# Patient Record
Sex: Male | Born: 1955 | Race: Black or African American | Hispanic: No | Smoking: Former smoker
Health system: Southern US, Community
[De-identification: ages and names within clinical notes are randomized; demographics above are authoritative.]

## PROBLEM LIST (undated history)

## (undated) DIAGNOSIS — J439 Emphysema, unspecified: Secondary | ICD-10-CM

## (undated) DIAGNOSIS — J45909 Unspecified asthma, uncomplicated: Secondary | ICD-10-CM

## (undated) DIAGNOSIS — M1990 Unspecified osteoarthritis, unspecified site: Secondary | ICD-10-CM

## (undated) DIAGNOSIS — G629 Polyneuropathy, unspecified: Secondary | ICD-10-CM

---

## 2011-01-06 ENCOUNTER — Emergency Department (HOSPITAL_COMMUNITY): Payer: PRIVATE HEALTH INSURANCE

## 2011-01-06 ENCOUNTER — Emergency Department (HOSPITAL_COMMUNITY)
Admission: EM | Admit: 2011-01-06 | Discharge: 2011-01-06 | Disposition: A | Discharge status: Home / Self Care | Payer: PRIVATE HEALTH INSURANCE | Attending: Emergency Medicine | Admitting: Emergency Medicine

## 2011-01-06 DIAGNOSIS — Z79899 Other long term (current) drug therapy: Secondary | ICD-10-CM | POA: Insufficient documentation

## 2011-01-06 DIAGNOSIS — Z7982 Long term (current) use of aspirin: Secondary | ICD-10-CM | POA: Insufficient documentation

## 2011-01-06 DIAGNOSIS — J449 Chronic obstructive pulmonary disease, unspecified: Secondary | ICD-10-CM | POA: Insufficient documentation

## 2011-01-06 DIAGNOSIS — M549 Dorsalgia, unspecified: Secondary | ICD-10-CM | POA: Insufficient documentation

## 2011-01-06 DIAGNOSIS — J4489 Other specified chronic obstructive pulmonary disease: Secondary | ICD-10-CM | POA: Insufficient documentation

## 2011-01-06 DIAGNOSIS — R040 Epistaxis: Secondary | ICD-10-CM | POA: Insufficient documentation

## 2011-01-06 DIAGNOSIS — G8929 Other chronic pain: Secondary | ICD-10-CM | POA: Insufficient documentation

## 2011-01-06 DIAGNOSIS — R079 Chest pain, unspecified: Secondary | ICD-10-CM | POA: Insufficient documentation

## 2011-01-06 LAB — CBC
HCT: 31.6 % — ABNORMAL LOW (ref 39.0–52.0)
Hemoglobin: 10.3 g/dL — ABNORMAL LOW (ref 13.0–17.0)
MCH: 25.8 pg — ABNORMAL LOW (ref 26.0–34.0)
MCHC: 32.6 g/dL (ref 30.0–36.0)
MCV: 79.2 fL (ref 78.0–100.0)

## 2011-01-06 LAB — POCT I-STAT TROPONIN I: Troponin i, poc: 0 ng/mL (ref 0.00–0.08)

## 2011-01-08 ENCOUNTER — Emergency Department (HOSPITAL_COMMUNITY): Payer: PRIVATE HEALTH INSURANCE

## 2011-01-08 ENCOUNTER — Inpatient Hospital Stay (HOSPITAL_COMMUNITY)
Admission: EM | Admit: 2011-01-08 | Discharge: 2011-01-11 | DRG: 812 | Disposition: A | Discharge status: Home / Self Care | Payer: PRIVATE HEALTH INSURANCE | Attending: Internal Medicine | Admitting: Internal Medicine

## 2011-01-08 DIAGNOSIS — D500 Iron deficiency anemia secondary to blood loss (chronic): Principal | ICD-10-CM | POA: Diagnosis present

## 2011-01-08 DIAGNOSIS — J45909 Unspecified asthma, uncomplicated: Secondary | ICD-10-CM | POA: Diagnosis present

## 2011-01-08 DIAGNOSIS — Z7982 Long term (current) use of aspirin: Secondary | ICD-10-CM

## 2011-01-08 DIAGNOSIS — E860 Dehydration: Secondary | ICD-10-CM | POA: Diagnosis present

## 2011-01-08 DIAGNOSIS — R040 Epistaxis: Secondary | ICD-10-CM | POA: Diagnosis present

## 2011-01-08 DIAGNOSIS — Z79899 Other long term (current) drug therapy: Secondary | ICD-10-CM

## 2011-01-08 DIAGNOSIS — Z87891 Personal history of nicotine dependence: Secondary | ICD-10-CM

## 2011-01-08 DIAGNOSIS — J3489 Other specified disorders of nose and nasal sinuses: Secondary | ICD-10-CM | POA: Diagnosis present

## 2011-01-08 LAB — POCT I-STAT, CHEM 8
BUN: 16 mg/dL (ref 6–23)
Chloride: 105 mEq/L (ref 96–112)
Creatinine, Ser: 0.9 mg/dL (ref 0.50–1.35)
Potassium: 3.7 mEq/L (ref 3.5–5.1)
Sodium: 138 mEq/L (ref 135–145)

## 2011-01-08 LAB — PROTIME-INR
INR: 1.05 (ref 0.00–1.49)
Prothrombin Time: 13.9 seconds (ref 11.6–15.2)

## 2011-01-08 LAB — CBC
Hemoglobin: 9.3 g/dL — ABNORMAL LOW (ref 13.0–17.0)
MCH: 25.5 pg — ABNORMAL LOW (ref 26.0–34.0)
MCV: 78.6 fL (ref 78.0–100.0)
RBC: 3.65 MIL/uL — ABNORMAL LOW (ref 4.22–5.81)
WBC: 4.6 10*3/uL (ref 4.0–10.5)

## 2011-01-09 LAB — CBC
HCT: 27.6 % — ABNORMAL LOW (ref 39.0–52.0)
MCH: 25.1 pg — ABNORMAL LOW (ref 26.0–34.0)
MCV: 79.5 fL (ref 78.0–100.0)
Platelets: 210 10*3/uL (ref 150–400)
RBC: 3.47 MIL/uL — ABNORMAL LOW (ref 4.22–5.81)
WBC: 3.9 10*3/uL — ABNORMAL LOW (ref 4.0–10.5)

## 2011-01-09 LAB — RAPID URINE DRUG SCREEN, HOSP PERFORMED
Amphetamines: NOT DETECTED
Barbiturates: NOT DETECTED
Benzodiazepines: NOT DETECTED
Cocaine: NOT DETECTED
Opiates: POSITIVE — AB
Tetrahydrocannabinol: NOT DETECTED

## 2011-01-09 LAB — APTT: aPTT: 35 seconds (ref 24–37)

## 2011-01-09 LAB — BASIC METABOLIC PANEL
CO2: 22 mEq/L (ref 19–32)
Calcium: 8.7 mg/dL (ref 8.4–10.5)
Chloride: 105 mEq/L (ref 96–112)
Glucose, Bld: 79 mg/dL (ref 70–99)
Potassium: 3.7 mEq/L (ref 3.5–5.1)
Sodium: 136 mEq/L (ref 135–145)

## 2011-01-09 LAB — DIFFERENTIAL
Eosinophils Absolute: 0.2 10*3/uL (ref 0.0–0.7)
Lymphocytes Relative: 38 % (ref 12–46)
Lymphs Abs: 1.5 10*3/uL (ref 0.7–4.0)
Monocytes Relative: 12 % (ref 3–12)
Neutrophils Relative %: 46 % (ref 43–77)

## 2011-01-09 LAB — POCT I-STAT TROPONIN I: Troponin i, poc: 0 ng/mL (ref 0.00–0.08)

## 2011-01-10 LAB — CBC
Hemoglobin: 10.3 g/dL — ABNORMAL LOW (ref 13.0–17.0)
MCH: 25.9 pg — ABNORMAL LOW (ref 26.0–34.0)
MCHC: 32.9 g/dL (ref 30.0–36.0)
Platelets: 222 10*3/uL (ref 150–400)
RDW: 17.1 % — ABNORMAL HIGH (ref 11.5–15.5)

## 2011-01-11 LAB — CROSSMATCH
ABO/RH(D): O POS
Antibody Screen: NEGATIVE
Unit division: 0
Unit division: 0
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
## 2011-02-23 NOTE — H&P (Signed)
NAME:  Jeremy Galvan, Jeremy Galvan NO.:  [id number]  MEDICAL RECORD NO.:  [id number]  LOCATION:  [facility]                         FACILITY:  [facility]  PHYSICIAN:  Carlota Raspberry, MD         DATE OF BIRTH:  1955-11-21  DATE OF ADMISSION:  01/08/2011 DATE OF DISCHARGE:                             HISTORY & PHYSICAL   CHIEF COMPLAINT:  Shortness of breath, nosebleeds, and syncopal episodes.  HISTORY OF PRESENT ILLNESS:  This 55 year old male with a complicated history of nosebleeds and possible septal perforation presents with headaches, shortness of breath, nosebleeds, and several syncopal episodes over the last couple of weeks.  The patient states that starting in the fall of last year he began having nosebleeds several times per week and he would bleed for 15-20 minutes per episode.  These were associated with weakness and decreased appetite over the last couple of months and in the last 2-3 weeks, he has had two to three episodes of syncope and he feels that he is getting more weak.  He presented to the emergency room 2 days ago for this and was also complaining of some atypical chest pain at that time.  He was ruled out and sent home with the instructions to follow up with an ENT doctor here in [place].  He was on his way to see an ENT in [place] in the [facility] system today but was having more nosebleeds and therefore presented to the ED instead.  Here in the emergency room his vital signs have been stable with systolics ranging in the 115-120s over 70s to 80s with pulses in the 80s and oxygen saturations in the 98% on room air.  His hematocrit was initially seen to be 32 on point of care i-STAT lab values, but then a true CBC showed it to be 28.7, which was down from about 31 two days ago.  Platelet count was normal and his coags showed an INR of 1.05.  No PTT was drawn though his chemistry panel appears overall unremarkable with a BUN and creatinine of 16  and 0.9  The patient relates a long and complicated history of these nosebleeds such that he was originally seen by a doctor in [place], [place], who did some type of procedure to put some button into the septum of his nose where he has a hole.  This stopped the bleeding for about a month or so ago, but after sneezing it was dislodged and the button was taken out.  He was then referred to see an ENT in [facility] after being told that he needed definitive surgical procedure for this, however, this was apparently not done.  He was now trying to establish care with an ENT in [place] to have this done.  Review of systems also positive for headache, diffuse weakness and fatigue, and syncopal episodes.  He has some atypical chest pain, but it has been ruled out in the ED.  His EKG appears unchanged with some LVH and some premature beats and what appears to be some Q waves in his inferior leads as well too.  Review of systems otherwise negative.  Past medical history  is significant for what appears to be Q waves in his inferior leads and what he self describes as history of a racing heart rate for which he was being seen quite frequently in the emergency room 6-7 years ago and eventually got a port placement for poor venous access.  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  He also reports scoliosis, neuropathy, and a "problem with his right lung."  However, we do not have extensive medical records in our E chart.  Medications were unable to be reconciled as the patient did not have his list and was not really too clear on what he takes at home, but the ED report from two days ago shows that he takes aspirin, Ativan, benzonatate, iron, Levaquin, and potassium chloride.  He states that his sister took his medication  list when she left.  Allergies are noted to DARVOCET, IBUPROFEN, NITROGLYCERIN, ROBITUSSIN, and ULTRAM.  SOCIAL HISTORY:  He lives in [place], [place].  His primary care doctor is Dr. Byrd Hesselbach is up there as well.  He is a single father of three boys.  He does have a sister.  He is currently a nonsmoker but he does report a 15-year smoking history and quit 9 years ago.  He also quit drinking and doing smoking crack 9 years ago.  Notably, he states that he has only snorted cocaine twice.  He denies any IV drug use.  FAMILY HISTORY:  He denies any history of any bleeding problems.  His father had colon cancer.  His mother had heart problems.  PHYSICAL EXAM:  VITAL SIGNS:  Blood pressure was 115-120/70-80, pulses in the 80s, oxygen saturation 98% on room air. GENERAL:  He is a thin male lying in the hospital stretcher with his eyes shut.  He appears uncomfortable but not in distress. HEENT:  Pupils are equal and round and reactive to light.  He has no scleral icterus.  Examination of his nares with the speculum shows some dry crusted blood and a septal perforation.  He is not currently bleeding. LUNGS:  Clear to auscultation bilaterally.  No wheezes, crackles, or rales. HEART:  Regular rate and rhythm with slight systolic murmur.  He has a right-sided port in place. ABDOMEN:  Soft, nontender, nondistended. EXTREMITIES:  There is no bilateral lower extremity edema. SKIN:  Warm and nondiaphoretic. NEUROLOGIC:  He is intact.  He is spontaneously moving all of his extremities.  He is alert and conversant.  Labs are significant for normal chemistry.  Hematocrit 32 on i-STAT versus 28.7 versus a true CBC.  INR is 1.05.  Troponins are negative x2. Chest x-ray done in the emergency room shows mild bibasilar air space opacities, may reflect atelectasis or possibly pneumonia.  He had a CT head done for his headaches which shows no acute intracranial pathology but mild small vessel ischemic  microangiopathy.  ASSESSMENT/PLAN:  This is a 55 year old male with a history of severe asthma, ? inferior myocardial infarction who presents with a several month history of nosebleeds with what appears to be a septal perforation on physical exam, headache, shortness of breath, and fatigue possibly representing symptomatic anemia. 1. Anemia.  I am unclear what his true hematocrit is as his initial     hematocrit on arrival was 32, which is stable from 2 days ago,  however, repeat one done about 20 minutes later was 28.7 and he is     receiving IV fluids, so this may be dilutional as well.  Given that     his vital signs are stable and he is not coagulopathic and not     bleeding at present, I chose to wait until the morning and get a     repeat CBC to see what it truly is as opposed to empirically     transfusing the patient.  Nevertheless, I did speak with him that     transfusion may improve him, but I would like to see where his CBC     stands.  I will also stop the IV fluids that are running at     present. 2. Chest x-ray with bibasilar opacities.  He has no white count and no     reported fevers.  Therefore, I doubt that this represents     pneumonia, and therefore I will not cover with antibiotics at     present. 3. Epistaxis.  The patient has a complicated history and has seen     multiple providers for this and given the fact that he is now     evaluated twice in 2 days for this and it is complicated by syncope     that may be due to anemia, I think it warrants an inpatient ENT     evaluation.  So, we will consult ENT.  He was apparently supposed     to have some type of surgery for this.  In the morning would     recommend touching base with his outpatient providers to get a     better sense of what the true outpatient workup has been.  His PCP     is Dr. Byrd Hesselbach in [place] in [place] and he states that he saw     some type of ENT in [place], [place]. 4. FEN.  I will  stop the IV fluids as his vitals are stable and IV     fluids may be contributing to dilutional anemia.  We will give him     a regular diet. 5. Access.  He has a right-sided port in.  We will get a type and     cross. 6. Code status.  He is a full code.  We will admit the patient to team     4 [place].          ______________________________ Carlota Raspberry, MD     EB/MEDQ  D:  01/09/2011  T:  01/09/2011  Job:  [id number]  Electronically Signed by Carlota Raspberry MD on 02/23/2011 12:02:36 PM

## 2011-03-03 NOTE — Discharge Summary (Signed)
NAMEJAIQUAN, TEMME NO.:  [id number]  MEDICAL RECORD NO.:  [id number]  LOCATION:  3713                         FACILITY:  [facility]  PHYSICIAN:  Zannie Cove, MD     DATE OF BIRTH:  06-05-1955  DATE OF ADMISSION:  01/08/2011 DATE OF DISCHARGE:                              DISCHARGE SUMMARY   PRIMARY CARE PHYSICIAN:  Adonis Huguenin, MD in [place], [place].  DISCHARGE DIAGNOSES: 1. Chronic epistaxis. 2. 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 3. Syncope secondary to anemia. 4. Asthma. 5. History of shoulder and hip surgery. 6. History of neuropathy.  DISCHARGE MEDICATIONS: 1. Vicodin 5/325 one tablet q.6 h p.r.n. 2. Saline nasal spray, 1 spray nasally q.2-4 h p.r.n. 3. Ativan 1 mg tablet, half tab p.o. b.i.d. p.r.n. 4. Advair 250/50 two puffs q.12 h. 5. Albuterol inhaler 90 mcg 2 puffs q.4 hours p.r.n. 6. Ferrous sulfate 325 mg p.o. b.i.d. 7. Tylenol 500 mg p.o. q.8 h p.r.n.  CONSULTANTS:  Jefry H. Pollyann Kennedy, MD with ENT.  DIAGNOSTICS INVESTIGATIONS:  Chest x-ray, August 19, mild bibasilar airspace opacities, may reflect atelectasis or possibly pneumonia.  CT of the head showed no acute intracranial abnormality.  HOSPITAL COURSE:  Mr. Witucki is a 55 year old black male with history of chronic epistaxis for several years, presented to the hospital with worsening of his epistaxis, dizziness, syncopal events, headache, etc. On evaluation, he was found to have chronic septal perforation, normocytic anemia, and dehydration. 1. For his chronic epistaxis, ENT consultation was requested which was     a provided by Dr. Brynda Peon.  He felt that his perforation was     chronic and too large to benefit from surgery and recommended     saline nasal spray at this time and follow up in his office in his     epistaxis recurs.  The patient is also advised about cessation of     inhaled cocaine use. 2. Anemia secondary to longstanding epistaxis status post 1 unit  of     PRBC.  Hemoglobin improved from 8.7 to 10.3.  He is also being     continued on his ferrous sulfate twice a day. 3. Syncope, likely secondary to anemia, chronic epistaxis.  Did not     have any events on telemetry, ruled out for MI based on cardiac     markers.  Echocardiogram done, report is pending.  I do not expect     anything acute to delay his discharge.  The patient is anxious to     go home.  We will let him go home today.  It is anything remarkable     on echo, we will send a report to his primary doctor.  DISCHARGE CONDITION:  Stable.  VITAL SIGNS:  Temperature is 98.2, pulse 55, blood pressure 132/84, respirations 20, satting 99% on room air.  DISCHARGE LABS:  White count 7.3, hemoglobin 10.3, platelets 222.  DISCHARGE FOLLOWUP: 1. With primary physician, Dr. Byrd Hesselbach in 1 week. 2. Dr. Brynda Peon with ENT as needed.     Zannie Cove, MD     PJ/MEDQ  D:  01/11/2011  T:  01/11/2011  Job:  [id number]  cc:   Jefry H. Pollyann Kennedy, MD Adonis Huguenin, MD  Electronically Signed by Zannie Cove  on 03/03/2011 02:05:54 PM

## 2012-07-17 IMAGING — CR DG CHEST 2V
2 series · 2 of 2 positions shown · non-contrast
Comparison: None

CLINICAL DATA: Chest pain.

CHEST - 2 VIEW

[w chest pa]
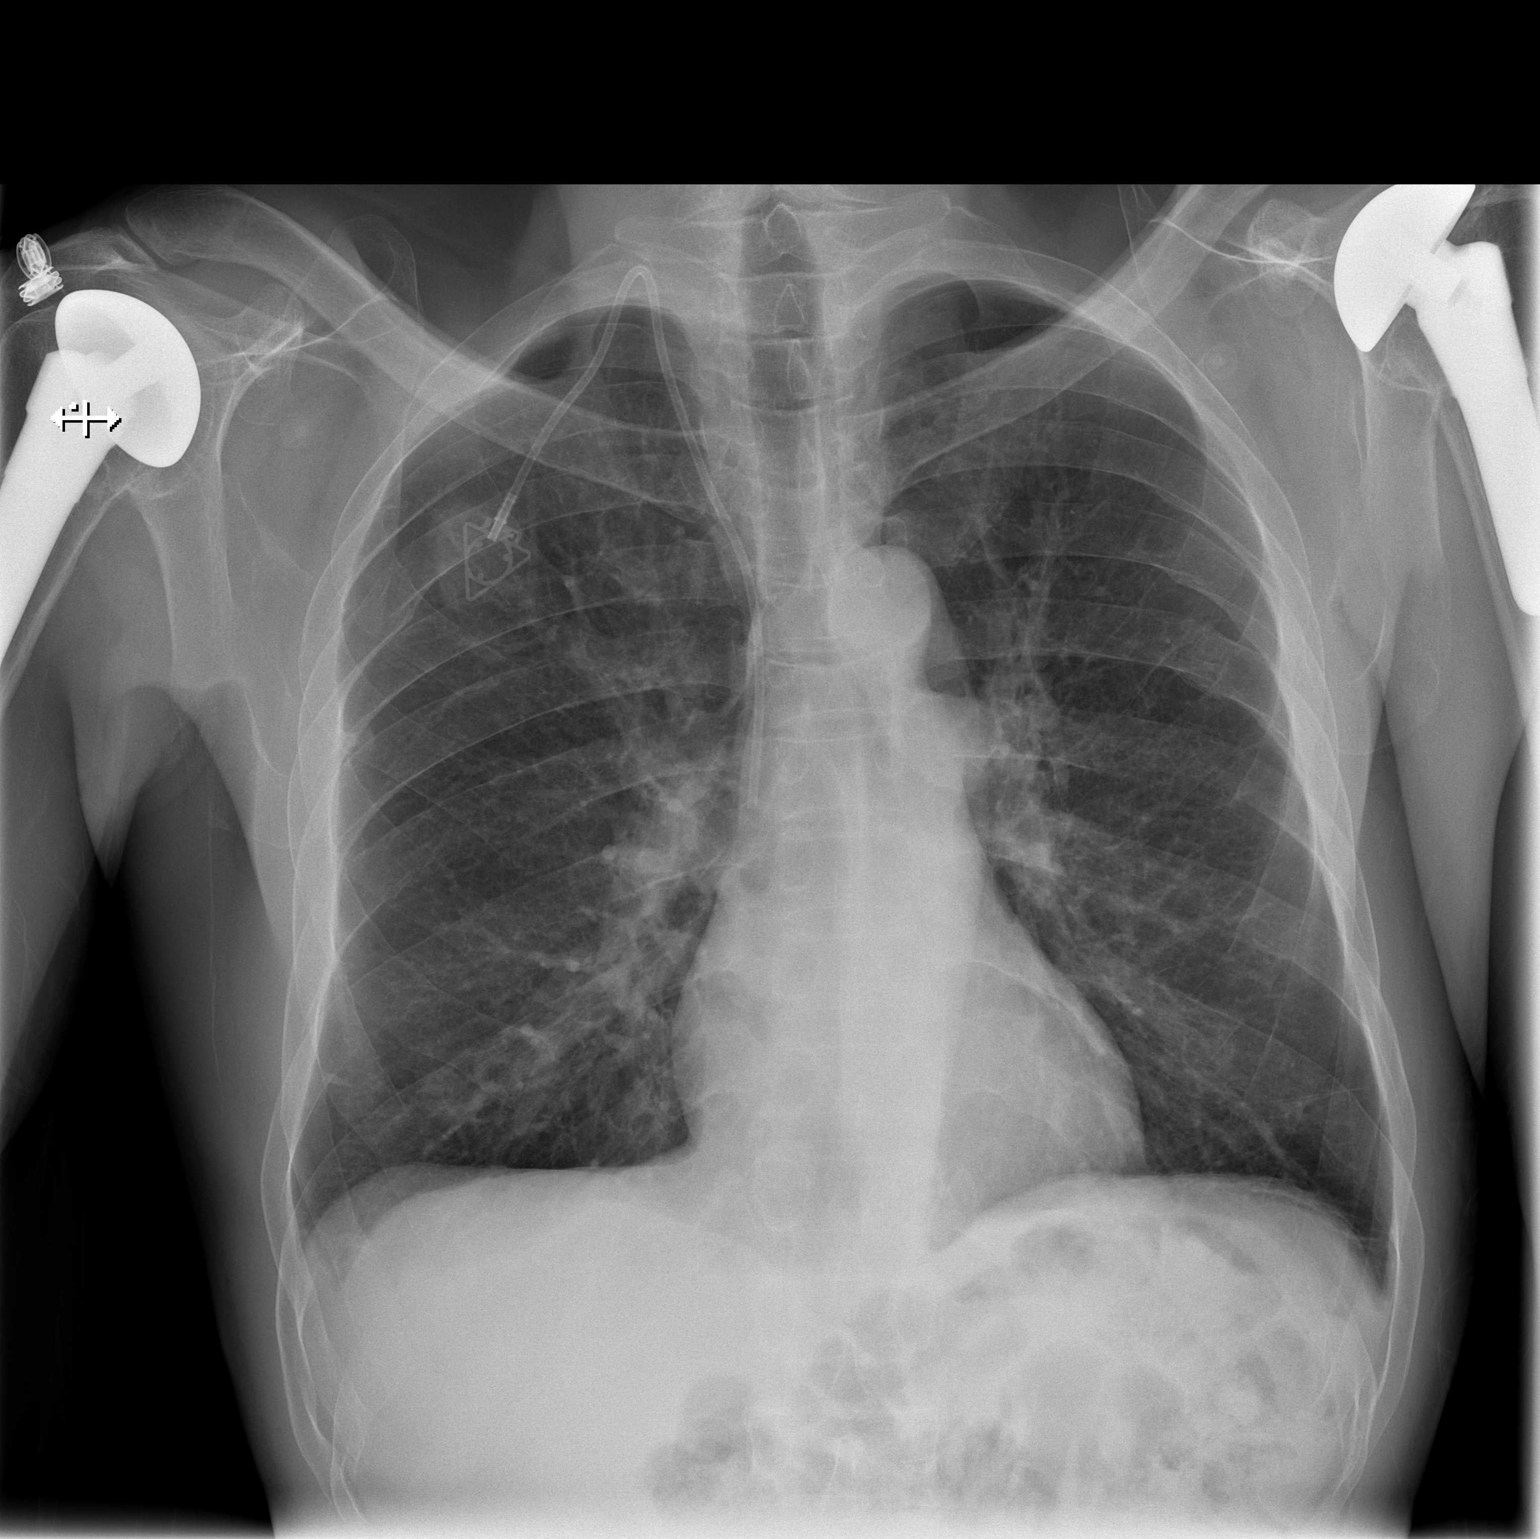

[w chest lat]
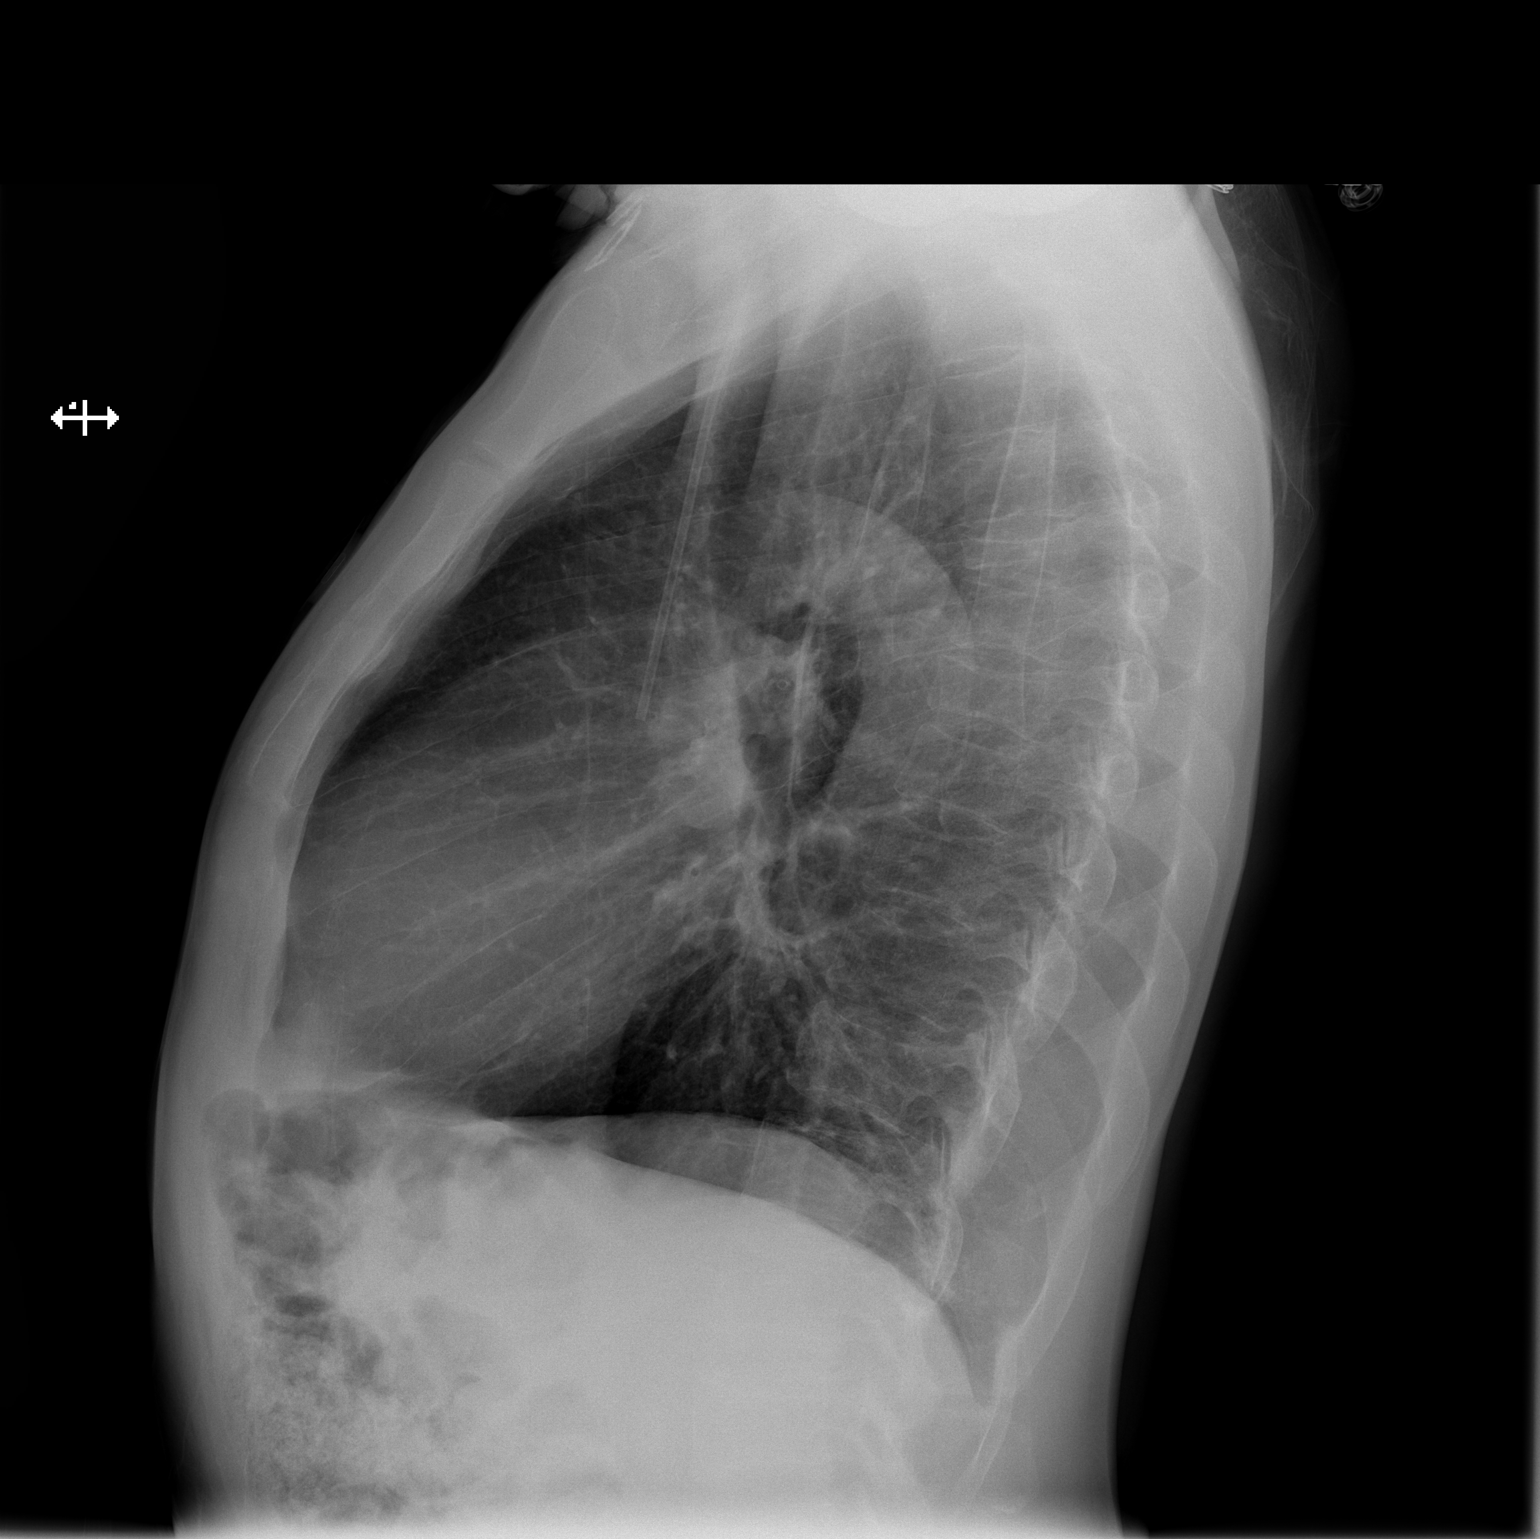

[2 of 2 positions shown; findings below may reference images not displayed]

FINDINGS: The Port-A-Cath is in good position.  There are bilateral
humeral prosthesis.  The cardiac silhouette, mediastinal and hilar
contours are within normal limits.  The lungs are clear.  No
worrisome masses or nodules.  Remote healed rib fractures are
noted.
IMPRESSION: No acute cardiopulmonary findings.

## 2012-07-19 IMAGING — CT CT HEAD W/O CM
1 of 2 series · 13 of 30 positions shown, 17 images · non-contrast
Comparison: None.

CLINICAL DATA: Altered level of consciousness; syncope.

CT HEAD WITHOUT CONTRAST
TECHNIQUE: Contiguous axial images were obtained from the base of
the skull through the vertex without contrast.

[Series 2: brain · axial · 0.49mm/px · z∈[+152,+277]mm · 13 of 28 slices shown, 17 images]
[im 2/28  brain]
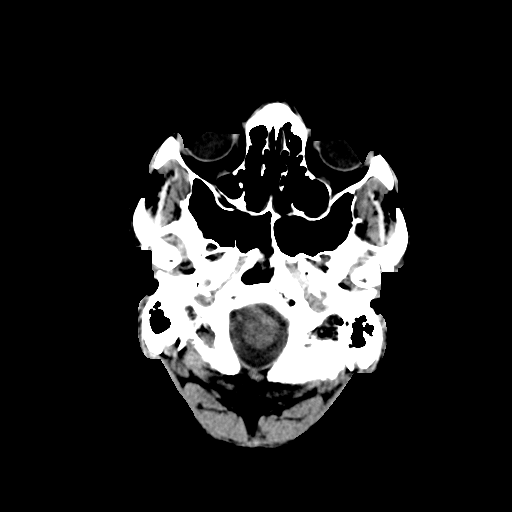
[im 2/28  bone]
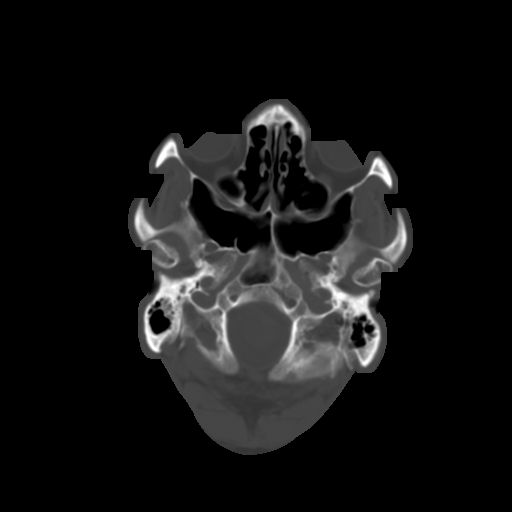
[im 4/28  brain]
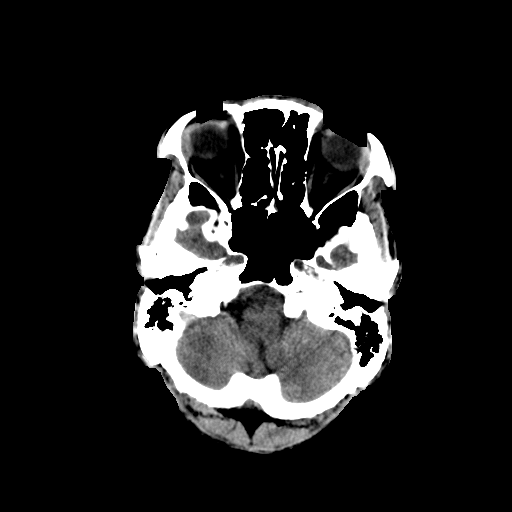
[im 6/28  brain]
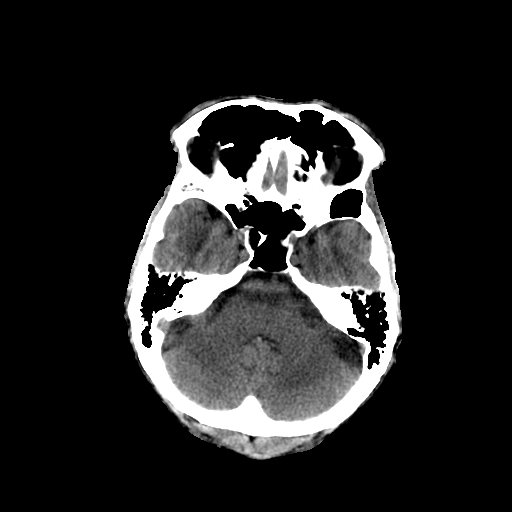
[im 8/28  brain]
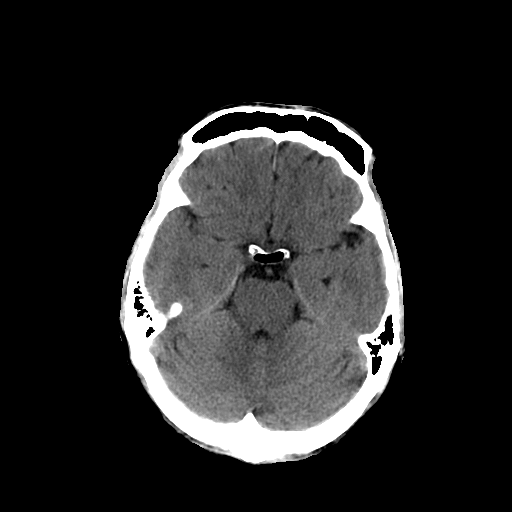
[im 10/28  brain]
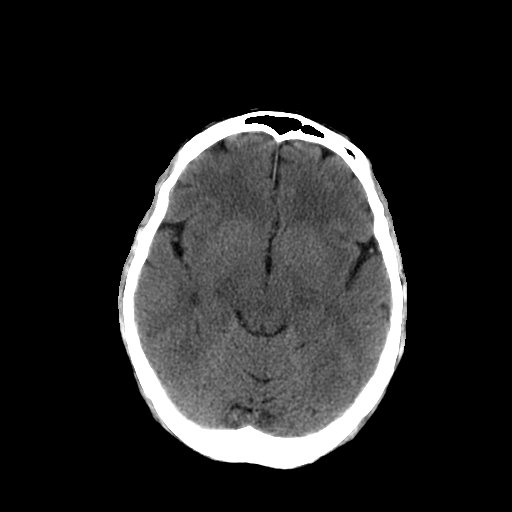
[im 10/28  bone]
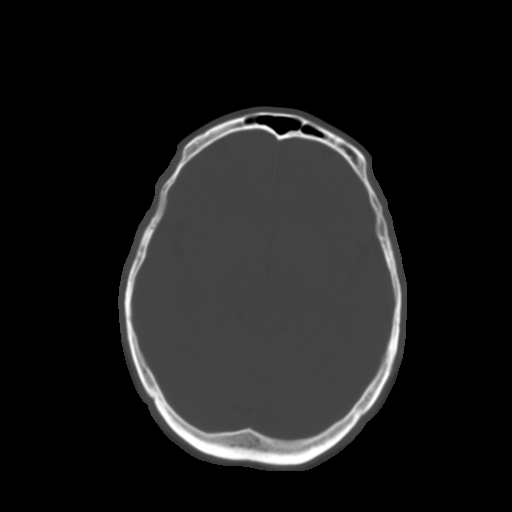
[im 12/28  brain]
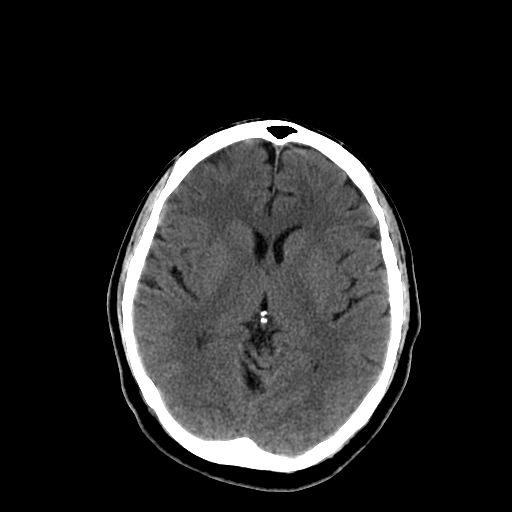
[im 14/28  brain]
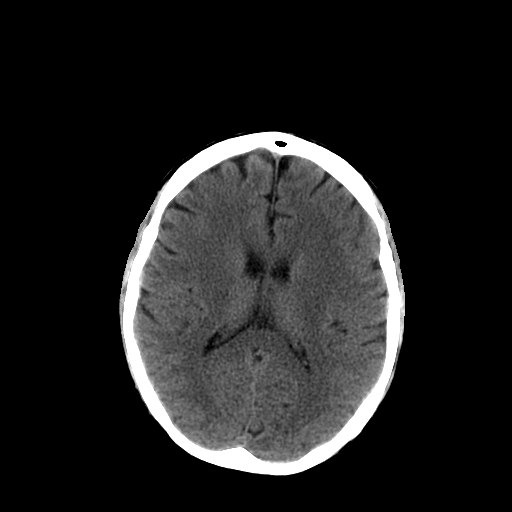
[im 16/28  brain]
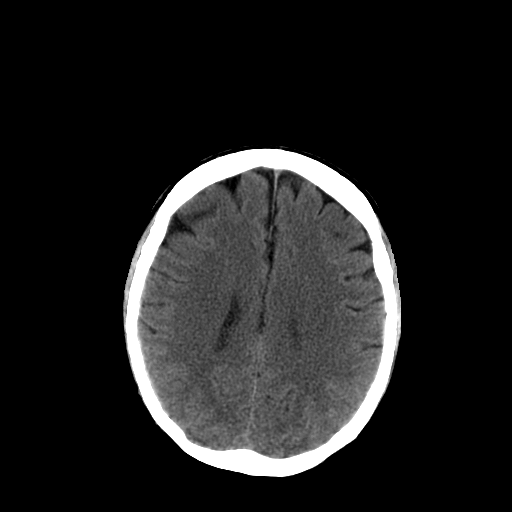
[im 18/28  brain]
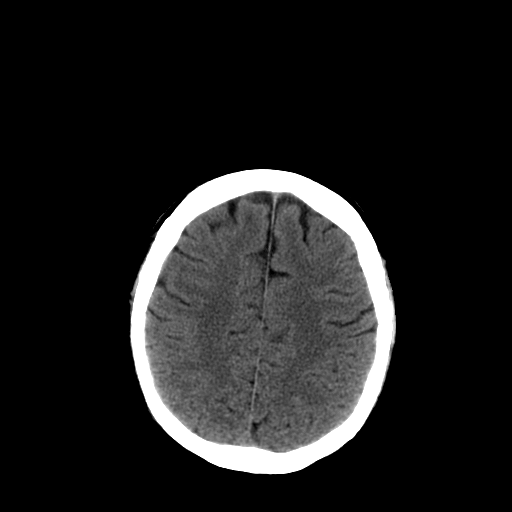
[im 18/28  bone]
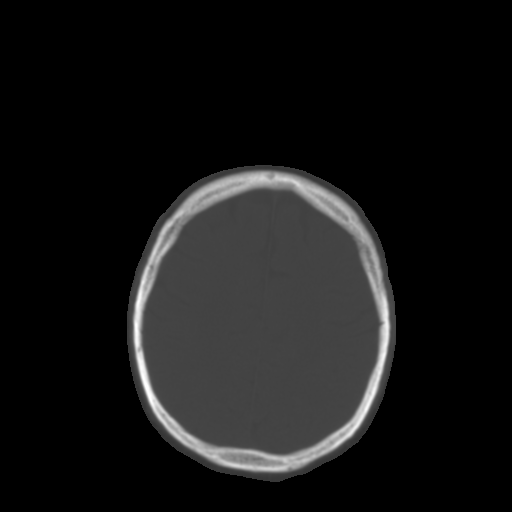
[im 20/28  brain]
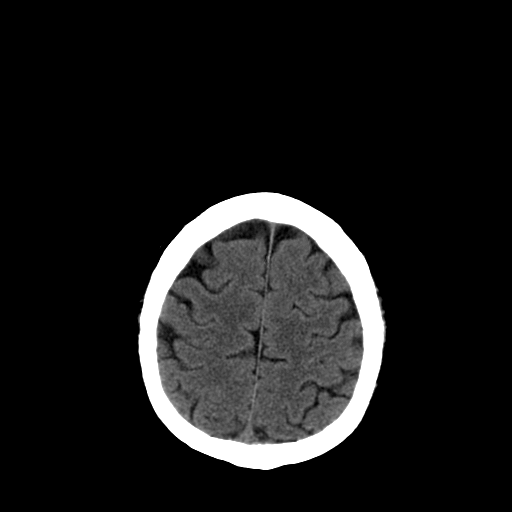
[im 22/28  brain]
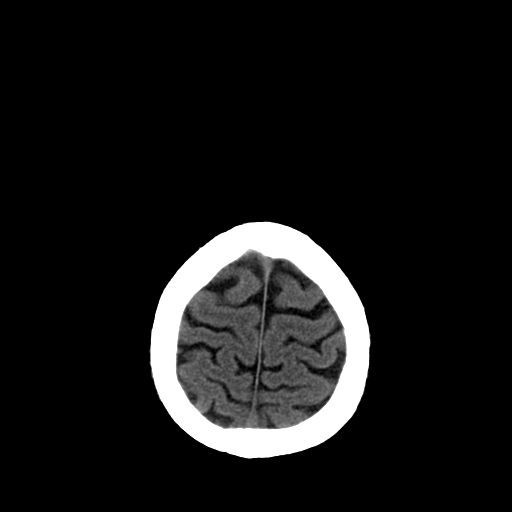
[im 24/28  brain]
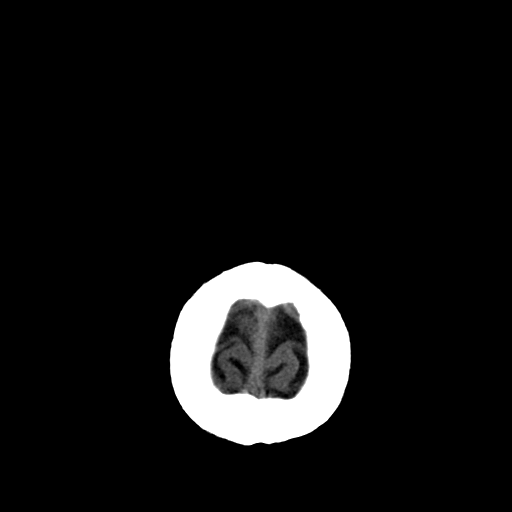
[im 26/28  brain]
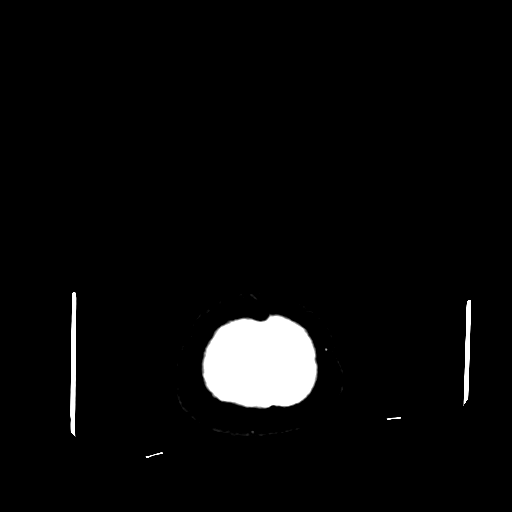
[im 26/28  bone]
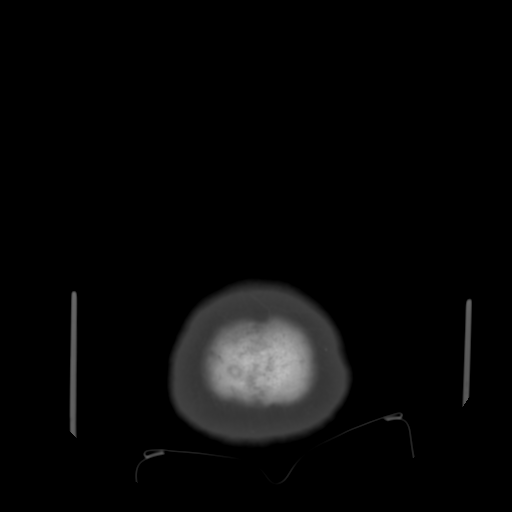

[13 of 30 positions shown; findings below may reference images not displayed]

FINDINGS: There is no evidence of acute infarction, mass lesion, or
intra- or extra-axial hemorrhage on CT.

Mild subcortical white matter change likely reflects small vessel
ischemic microangiopathy.

The posterior fossa, including the cerebellum, brainstem and fourth
ventricle, is within normal limits.  The third and lateral
ventricles, and basal ganglia are unremarkable in appearance.  The
cerebral hemispheres are symmetric in appearance, with normal gray-
white differentiation.  No mass effect or midline shift is seen.

There is no evidence of fracture; visualized osseous structures are
unremarkable in appearance.  The visualized portions of the orbits
are within normal limits.  The paranasal sinuses and mastoid air
cells are well-aerated.  No significant soft tissue abnormalities
are seen.
IMPRESSION: 1.  No acute intracranial pathology seen on CT.
2.  Mild small vessel ischemic microangiopathy.

## 2012-07-19 IMAGING — CR DG CHEST 2V
1 series · 1 of 1 positions shown · non-contrast
Comparison: Chest radiograph performed 01/06/2011

CLINICAL DATA: Chest pain; altered level of consciousness.
Epistaxis.

CHEST - 2 VIEW

[view not recorded]
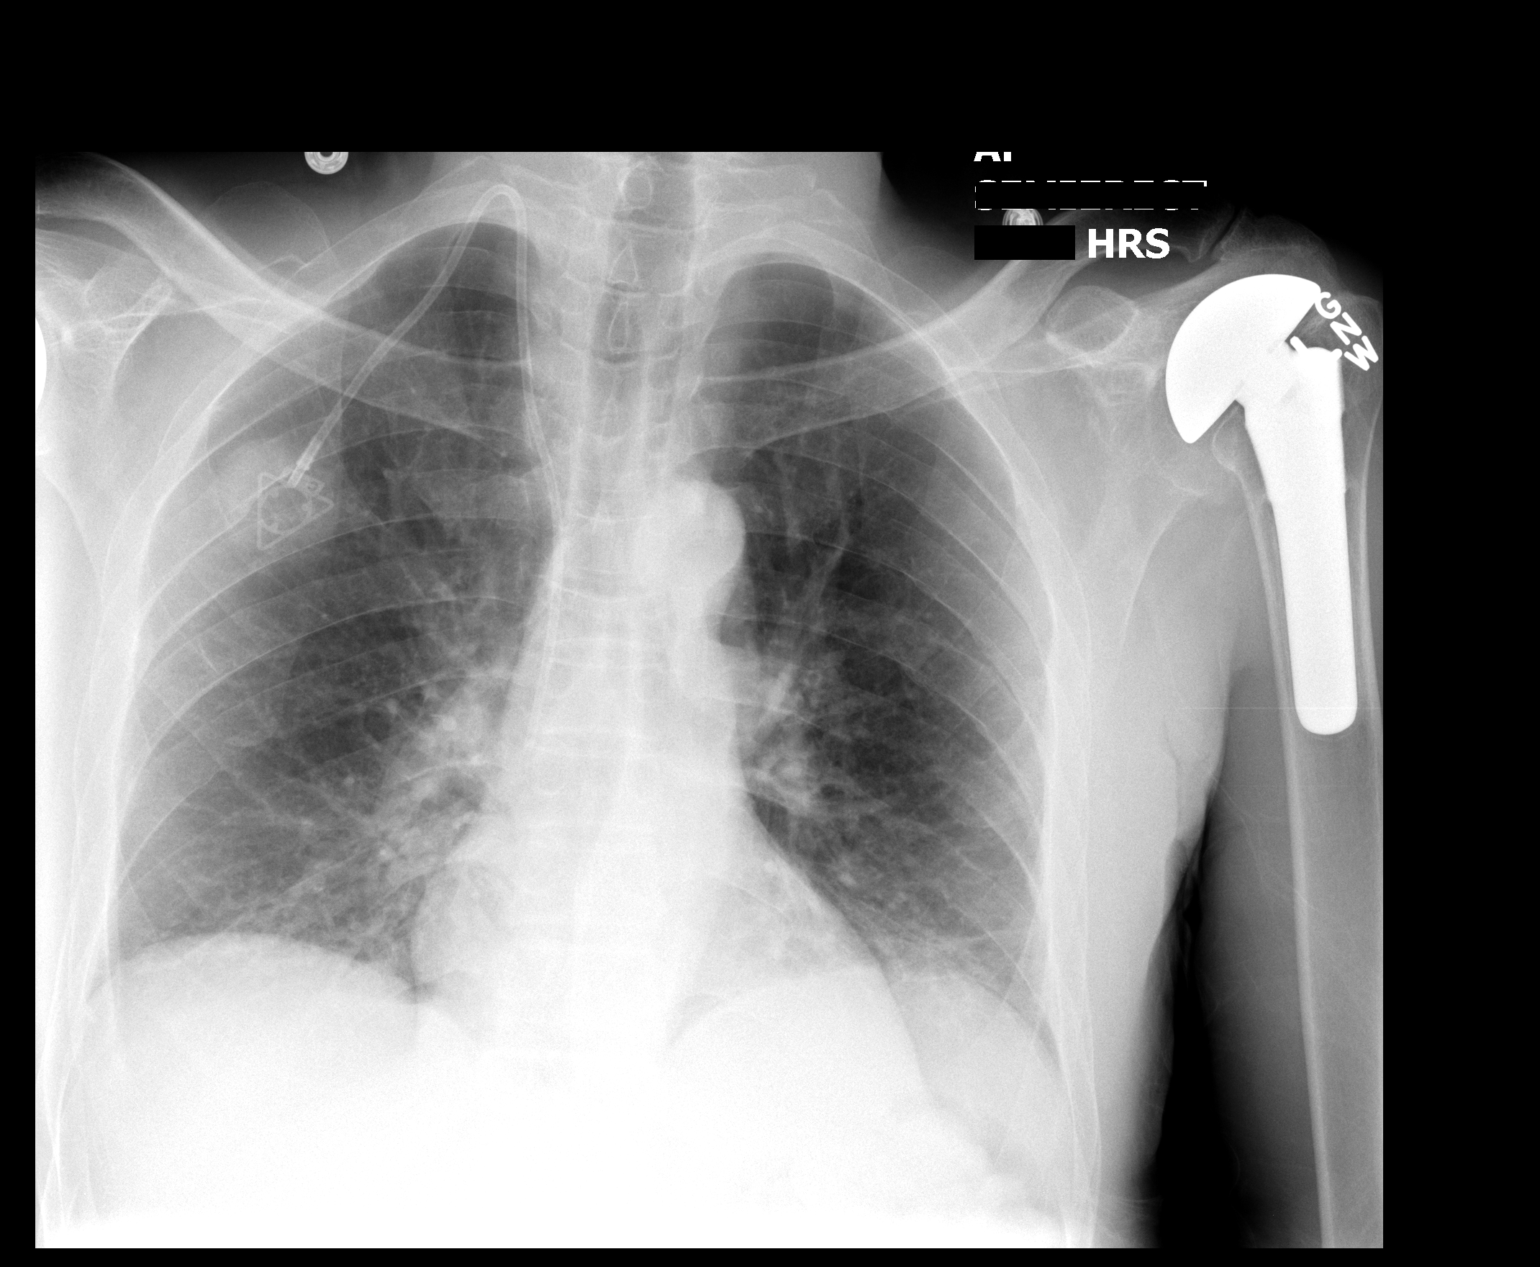

[1 of 1 positions shown; findings below may reference images not displayed]

FINDINGS: The lungs are well-aerated.  Mild bibasilar opacities may
reflect atelectasis or possibly pneumonia.  There is no evidence of
pleural effusion or pneumothorax.

The heart is normal in size; the mediastinal contour is within
normal limits. A right sided chest wall Mediport is noted ending at
the distal SVC.  No acute osseous abnormalities are seen.
Bilateral shoulder arthroplasties are grossly unremarkable in
appearance, though the right shoulder arthroplasty is only
minimally imaged.
IMPRESSION: Mild bibasilar airspace opacities may reflect atelectasis or
possibly pneumonia.

## 2017-04-20 ENCOUNTER — Encounter: Payer: Self-pay | Admitting: Internal Medicine

## 2017-04-20 ENCOUNTER — Inpatient Hospital Stay
Admission: AD | Admit: 2017-04-20 | Payer: Self-pay | Source: Other Acute Inpatient Hospital | Admitting: Internal Medicine

## 2017-04-20 NOTE — H&P (Signed)
61 yo male with apparently coffee ground emesis,  Hgb 12, currently at [facility] in [place], [place], where no GI coverage available.  bp stable per ED,  hr86 Manson PasseyBrown stool guiac negative Pt started on protonix GTT ED request transfer to [facility] for admission.  Accepted to Step down bed

## 2018-04-03 ENCOUNTER — Encounter: Payer: Self-pay | Admitting: Hematology and Oncology

## 2018-04-03 ENCOUNTER — Telehealth: Payer: Self-pay | Admitting: Hematology and Oncology

## 2018-04-03 NOTE — Telephone Encounter (Signed)
Received a hematology referral for anemia. Pt has been scheduled to see Dr. Caron Presumeuben on 11/29 at 9am. Aware to arrive 30 minutes early. Letter mailed.

## 2018-04-10 ENCOUNTER — Telehealth: Payer: Self-pay | Admitting: Hematology and Oncology

## 2018-04-10 ENCOUNTER — Encounter: Payer: Self-pay | Admitting: Hematology and Oncology

## 2018-04-10 NOTE — Telephone Encounter (Signed)
Pt has been rescheduled to see Dr. Caron Presumeuben on 12/6 at 1pm. New letter and directions mailed.

## 2018-04-20 ENCOUNTER — Encounter: Payer: Medicare Other | Admitting: Hematology and Oncology

## 2018-04-27 ENCOUNTER — Encounter: Payer: Medicare Other | Admitting: Hematology and Oncology

## 2018-05-01 ENCOUNTER — Telehealth: Payer: Self-pay | Admitting: Hematology

## 2018-05-01 NOTE — Telephone Encounter (Signed)
Pt has been rescheduled to see Dr. Candise CheKale on 1/8 at 1pm. Pt agreed tot he appt date and time.

## 2018-05-30 ENCOUNTER — Telehealth: Payer: Self-pay | Admitting: *Deleted

## 2018-05-30 ENCOUNTER — Inpatient Hospital Stay: Payer: Medicare Other | Attending: Hematology and Oncology | Admitting: Hematology

## 2018-05-30 NOTE — Telephone Encounter (Signed)
Attempted to contact patient: Left VM with following information.  Per Dr. Candise Che: Please contact patient regarding appt today at 1pm. Per Dr. 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 He will need to see another provider (Sickle [facility]) for continuing care, so does not need to come to appt at 1pm with Dr. Candise Che today. Scheduling will contact him to set up appt for another provider.  Contacted new patient scheduler Sue Lush B., who will follow up with [facility] on behalf of patient.

## 2018-06-05 ENCOUNTER — Telehealth: Payer: Self-pay | Admitting: Hematology

## 2018-06-05 NOTE — Telephone Encounter (Signed)
Cld the pt and informed him we will be sending his referral to the sickle cell center to be treated.

## 2018-06-19 ENCOUNTER — Encounter: Payer: Medicare Other | Admitting: Hematology

## 2020-08-20 ENCOUNTER — Encounter (HOSPITAL_COMMUNITY): Payer: Self-pay

## 2020-08-20 ENCOUNTER — Observation Stay (HOSPITAL_COMMUNITY): Payer: Medicare Other

## 2020-08-20 ENCOUNTER — Emergency Department (HOSPITAL_COMMUNITY): Payer: Medicare Other

## 2020-08-20 ENCOUNTER — Observation Stay (HOSPITAL_COMMUNITY)
Admission: EM | Admit: 2020-08-20 | Discharge: 2020-08-21 | Disposition: A | Discharge status: Home / Self Care | Payer: Medicare Other | Attending: Internal Medicine | Admitting: Internal Medicine

## 2020-08-20 DIAGNOSIS — Z20822 Contact with and (suspected) exposure to covid-19: Secondary | ICD-10-CM | POA: Diagnosis not present

## 2020-08-20 DIAGNOSIS — J45909 Unspecified asthma, uncomplicated: Secondary | ICD-10-CM | POA: Insufficient documentation

## 2020-08-20 DIAGNOSIS — I480 Paroxysmal atrial fibrillation: Principal | ICD-10-CM | POA: Insufficient documentation

## 2020-08-20 DIAGNOSIS — Z7901 Long term (current) use of anticoagulants: Secondary | ICD-10-CM | POA: Diagnosis not present

## 2020-08-20 DIAGNOSIS — R112 Nausea with vomiting, unspecified: Secondary | ICD-10-CM

## 2020-08-20 DIAGNOSIS — J449 Chronic obstructive pulmonary disease, unspecified: Secondary | ICD-10-CM | POA: Diagnosis not present

## 2020-08-20 DIAGNOSIS — E876 Hypokalemia: Secondary | ICD-10-CM | POA: Insufficient documentation

## 2020-08-20 DIAGNOSIS — Z880 Allergy status to penicillin: Secondary | ICD-10-CM | POA: Diagnosis not present

## 2020-08-20 DIAGNOSIS — M00061 Staphylococcal arthritis, right knee: Secondary | ICD-10-CM

## 2020-08-20 DIAGNOSIS — B9562 Methicillin resistant Staphylococcus aureus infection as the cause of diseases classified elsewhere: Secondary | ICD-10-CM

## 2020-08-20 DIAGNOSIS — M25561 Pain in right knee: Secondary | ICD-10-CM | POA: Diagnosis not present

## 2020-08-20 DIAGNOSIS — K250 Acute gastric ulcer with hemorrhage: Secondary | ICD-10-CM

## 2020-08-20 DIAGNOSIS — T8450XA Infection and inflammatory reaction due to unspecified internal joint prosthesis, initial encounter: Secondary | ICD-10-CM

## 2020-08-20 DIAGNOSIS — Z87891 Personal history of nicotine dependence: Secondary | ICD-10-CM | POA: Diagnosis not present

## 2020-08-20 DIAGNOSIS — R1013 Epigastric pain: Secondary | ICD-10-CM

## 2020-08-20 DIAGNOSIS — T8453XA Infection and inflammatory reaction due to internal right knee prosthesis, initial encounter: Secondary | ICD-10-CM | POA: Diagnosis not present

## 2020-08-20 DIAGNOSIS — Z7982 Long term (current) use of aspirin: Secondary | ICD-10-CM | POA: Insufficient documentation

## 2020-08-20 DIAGNOSIS — R1010 Upper abdominal pain, unspecified: Secondary | ICD-10-CM

## 2020-08-20 DIAGNOSIS — I4891 Unspecified atrial fibrillation: Secondary | ICD-10-CM

## 2020-08-20 DIAGNOSIS — A048 Other specified bacterial intestinal infections: Secondary | ICD-10-CM

## 2020-08-20 HISTORY — DX: Unspecified osteoarthritis, unspecified site: M19.90

## 2020-08-20 HISTORY — DX: Emphysema, unspecified: J43.9

## 2020-08-20 HISTORY — DX: Unspecified asthma, uncomplicated: J45.909

## 2020-08-20 HISTORY — DX: Polyneuropathy, unspecified: G62.9

## 2020-08-20 LAB — CBC WITH DIFFERENTIAL/PLATELET
Abs Immature Granulocytes: 0.02 10*3/uL (ref 0.00–0.07)
Basophils Absolute: 0 10*3/uL (ref 0.0–0.1)
Basophils Relative: 0 %
Eosinophils Absolute: 0 10*3/uL (ref 0.0–0.5)
Eosinophils Relative: 1 %
HCT: 37 % — ABNORMAL LOW (ref 39.0–52.0)
Hemoglobin: 11.3 g/dL — ABNORMAL LOW (ref 13.0–17.0)
Immature Granulocytes: 0 %
Lymphocytes Relative: 16 %
Lymphs Abs: 1.1 10*3/uL (ref 0.7–4.0)
MCH: 28.7 pg (ref 26.0–34.0)
MCHC: 30.5 g/dL (ref 30.0–36.0)
MCV: 93.9 fL (ref 80.0–100.0)
Monocytes Absolute: 1 10*3/uL (ref 0.1–1.0)
Monocytes Relative: 14 %
Neutro Abs: 4.6 10*3/uL (ref 1.7–7.7)
Neutrophils Relative %: 69 %
Platelets: 303 10*3/uL (ref 150–400)
RBC: 3.94 MIL/uL — ABNORMAL LOW (ref 4.22–5.81)
RDW: 18.1 % — ABNORMAL HIGH (ref 11.5–15.5)
WBC: 6.6 10*3/uL (ref 4.0–10.5)
nRBC: 0 % (ref 0.0–0.2)

## 2020-08-20 LAB — COMPREHENSIVE METABOLIC PANEL
ALT: 15 U/L (ref 0–44)
AST: 24 U/L (ref 15–41)
Albumin: 2.9 g/dL — ABNORMAL LOW (ref 3.5–5.0)
Alkaline Phosphatase: 98 U/L (ref 38–126)
Anion gap: 11 (ref 5–15)
BUN: 10 mg/dL (ref 8–23)
CO2: 20 mmol/L — ABNORMAL LOW (ref 22–32)
Calcium: 8.5 mg/dL — ABNORMAL LOW (ref 8.9–10.3)
Chloride: 100 mmol/L (ref 98–111)
Creatinine, Ser: 1.12 mg/dL (ref 0.61–1.24)
GFR, Estimated: >60 mL/min (ref >60)
Glucose, Bld: 195 mg/dL — ABNORMAL HIGH (ref 70–99)
Potassium: 2.9 mmol/L — ABNORMAL LOW (ref 3.5–5.1)
Sodium: 131 mmol/L — ABNORMAL LOW (ref 135–145)
Total Bilirubin: 0.6 mg/dL (ref 0.3–1.2)
Total Protein: 8 g/dL (ref 6.5–8.1)

## 2020-08-20 LAB — RESP PANEL BY RT-PCR (FLU A&B, COVID) ARPGX2
Influenza A by PCR: NEGATIVE
Influenza B by PCR: NEGATIVE
SARS Coronavirus 2 by RT PCR: NEGATIVE

## 2020-08-20 LAB — MAGNESIUM: Magnesium: 1.7 mg/dL (ref 1.7–2.4)

## 2020-08-20 LAB — SAMPLE TO BLOOD BANK

## 2020-08-20 LAB — PROTIME-INR
INR: 1.1 (ref 0.8–1.2)
Prothrombin Time: 13.5 seconds (ref 11.4–15.2)

## 2020-08-20 LAB — TROPONIN I (HIGH SENSITIVITY): Troponin I (High Sensitivity): 4 ng/L (ref <18)

## 2020-08-20 MED ORDER — POLYETHYLENE GLYCOL 3350 17 G PO PACK: 17.0000 g | PACK | Freq: Every day | ORAL | Status: DC | PRN

## 2020-08-20 MED ORDER — DIPHENHYDRAMINE HCL 50 MG/ML IJ SOLN
INTRAMUSCULAR | Status: AC
  Administered 2020-08-20: 12.5 mg via INTRAVENOUS
  Filled 2020-08-20: qty 1

## 2020-08-20 MED ORDER — BISMUTH SUBSALICYLATE 262 MG PO CHEW
524.0000 mg | CHEWABLE_TABLET | Freq: Three times a day (TID) | ORAL | Status: DC
  Administered 2020-08-20 – 2020-08-21 (×3): 524 mg via ORAL
  Filled 2020-08-20 (×6): qty 2

## 2020-08-20 MED ORDER — ADULT MULTIVITAMIN W/MINERALS CH: 1.0000 | ORAL_TABLET | Freq: Every day | ORAL | Status: DC

## 2020-08-20 MED ORDER — ZOLPIDEM TARTRATE 5 MG PO TABS
5.0000 mg | ORAL_TABLET | Freq: Every day | ORAL | Status: DC
  Administered 2020-08-20: 5 mg via ORAL
  Filled 2020-08-20: qty 1

## 2020-08-20 MED ORDER — ONDANSETRON HCL 4 MG/2ML IJ SOLN
4.0000 mg | Freq: Once | INTRAMUSCULAR | Status: AC
  Administered 2020-08-20: 4 mg via INTRAVENOUS
  Filled 2020-08-20: qty 2

## 2020-08-20 MED ORDER — TAMSULOSIN HCL 0.4 MG PO CAPS
0.4000 mg | ORAL_CAPSULE | Freq: Every day | ORAL | Status: DC
Start: 2020-08-21 — End: 2020-08-22
  Administered 2020-08-21: 0.4 mg via ORAL
  Filled 2020-08-20: qty 1

## 2020-08-20 MED ORDER — DICYCLOMINE HCL 10 MG PO CAPS
10.0000 mg | ORAL_CAPSULE | Freq: Two times a day (BID) | ORAL | Status: DC
  Administered 2020-08-20 – 2020-08-21 (×2): 10 mg via ORAL
  Filled 2020-08-20 (×3): qty 1

## 2020-08-20 MED ORDER — ADULT MULTIVITAMIN W/MINERALS CH
1.0000 | ORAL_TABLET | Freq: Every day | ORAL | Status: DC
  Administered 2020-08-21: 1 via ORAL
  Filled 2020-08-20: qty 1

## 2020-08-20 MED ORDER — LACTATED RINGERS IV BOLUS
1000.0000 mL | Freq: Once | INTRAVENOUS | Status: AC
  Administered 2020-08-20: 1000 mL via INTRAVENOUS

## 2020-08-20 MED ORDER — SODIUM CHLORIDE 0.9 % IV BOLUS
1000.0000 mL | Freq: Once | INTRAVENOUS | Status: AC
  Administered 2020-08-20: 1000 mL via INTRAVENOUS

## 2020-08-20 MED ORDER — LIDOCAINE VISCOUS HCL 2 % MT SOLN
15.0000 mL | Freq: Four times a day (QID) | OROMUCOSAL | Status: DC | PRN
  Filled 2020-08-20 (×2): qty 15

## 2020-08-20 MED ORDER — METRONIDAZOLE 500 MG PO TABS
500.0000 mg | ORAL_TABLET | Freq: Four times a day (QID) | ORAL | Status: DC
  Administered 2020-08-20 – 2020-08-21 (×3): 500 mg via ORAL
  Filled 2020-08-20 (×3): qty 1

## 2020-08-20 MED ORDER — SODIUM CHLORIDE 0.9% FLUSH
3.0000 mL | Freq: Two times a day (BID) | INTRAVENOUS | Status: DC
  Administered 2020-08-21: 3 mL via INTRAVENOUS

## 2020-08-20 MED ORDER — ACETAMINOPHEN 650 MG RE SUPP: 650.0000 mg | Freq: Four times a day (QID) | RECTAL | Status: DC | PRN

## 2020-08-20 MED ORDER — FENTANYL CITRATE (PF) 100 MCG/2ML IJ SOLN
50.0000 ug | Freq: Once | INTRAMUSCULAR | Status: AC
  Administered 2020-08-20: 50 ug via INTRAVENOUS
  Filled 2020-08-20: qty 2

## 2020-08-20 MED ORDER — PROCHLORPERAZINE EDISYLATE 10 MG/2ML IJ SOLN
5.0000 mg | Freq: Once | INTRAMUSCULAR | Status: AC
  Administered 2020-08-20: 5 mg via INTRAVENOUS
  Filled 2020-08-20: qty 2

## 2020-08-20 MED ORDER — ACETAMINOPHEN 325 MG PO TABS
650.0000 mg | ORAL_TABLET | Freq: Four times a day (QID) | ORAL | Status: DC | PRN
  Administered 2020-08-20: 650 mg via ORAL
  Filled 2020-08-20: qty 2

## 2020-08-20 MED ORDER — ALUM & MAG HYDROXIDE-SIMETH 200-200-20 MG/5ML PO SUSP
30.0000 mL | Freq: Four times a day (QID) | ORAL | Status: DC | PRN
  Filled 2020-08-20: qty 30

## 2020-08-20 MED ORDER — TETRACYCLINE HCL 250 MG PO CAPS
500.0000 mg | ORAL_CAPSULE | Freq: Four times a day (QID) | ORAL | Status: DC
  Administered 2020-08-20 – 2020-08-21 (×3): 500 mg via ORAL
  Filled 2020-08-20 (×6): qty 2

## 2020-08-20 MED ORDER — POTASSIUM CHLORIDE CRYS ER 20 MEQ PO TBCR
40.0000 meq | EXTENDED_RELEASE_TABLET | Freq: Three times a day (TID) | ORAL | Status: AC
  Administered 2020-08-20 (×2): 40 meq via ORAL
  Filled 2020-08-20 (×2): qty 2

## 2020-08-20 MED ORDER — PANTOPRAZOLE SODIUM 40 MG PO TBEC: 40.0000 mg | DELAYED_RELEASE_TABLET | Freq: Every day | ORAL | Status: DC

## 2020-08-20 MED ORDER — AMITRIPTYLINE HCL 25 MG PO TABS
25.0000 mg | ORAL_TABLET | Freq: Every day | ORAL | Status: DC
  Administered 2020-08-20: 25 mg via ORAL
  Filled 2020-08-20: qty 1

## 2020-08-20 MED ORDER — NALOXONE HCL 0.4 MG/ML IJ SOLN
0.2000 mg | Freq: Once | INTRAMUSCULAR | Status: AC
  Administered 2020-08-20: 0.2 mg via INTRAVENOUS
  Filled 2020-08-20: qty 1

## 2020-08-20 MED ORDER — DIPHENHYDRAMINE HCL 50 MG/ML IJ SOLN: 12.5000 mg | Freq: Once | INTRAMUSCULAR | Status: AC

## 2020-08-20 MED ORDER — GABAPENTIN 400 MG PO CAPS
800.0000 mg | ORAL_CAPSULE | Freq: Two times a day (BID) | ORAL | Status: DC
  Administered 2020-08-20 – 2020-08-21 (×2): 800 mg via ORAL
  Filled 2020-08-20 (×2): qty 2

## 2020-08-20 MED ORDER — PANTOPRAZOLE SODIUM 40 MG PO TBEC
40.0000 mg | DELAYED_RELEASE_TABLET | Freq: Two times a day (BID) | ORAL | Status: DC
  Administered 2020-08-20 – 2020-08-21 (×2): 40 mg via ORAL
  Filled 2020-08-20 (×2): qty 1

## 2020-08-20 NOTE — ED Notes (Signed)
MD notified of pt pain

## 2020-08-20 NOTE — ED Notes (Signed)
Pt continues to report nausea and pain at 10/10 scale. MD notified. Waiting for orders. VS stable. Continuous cardiac monitoring in place.

## 2020-08-20 NOTE — H&P (Addendum)
Date: 08/20/2020               Patient Name:  Jeremy Galvan MRN: [id number]  DOB: November 12, 1955 Age / Sex: 65 y.o., male   PCP: Pcp, No         Medical Service: Internal Medicine Teaching Service         Attending Physician: Dr. Velna Ochs, MD    First Contact: Dr. Cato Mulligan, MD Pager: [phone]  Second Contact: Dr. Jose Persia, MD Pager: [phone]       After Hours (After 5p/  First Contact Pager: [phone]  weekends / holidays): Second Contact Pager: [phone]   Chief Complaint: Abdominal pain  History of Present Illness:  Jeremy Galvan is a 65 year old man with past medical history significant for H. Pylori infection (currently on quadruple therapy), iron deficiency anemia (on iron supplementation), paroxysmal atrial fibrillation (not anticoagulated), right knee periprosthetic MRSA joint infection, and COPD who presented to [facility] on 08/20/20 for evaluation of abdominal pain and dark-colored vomitus.  Patient reports that he was in his normal state of health last night. At approximately 4:00AM, patient reports waking up with sudden onset, 9 out of 10, throbbing epigastric and left-sided chest pain. Shortly thereafter, patient had episode of vomiting dark vomitus and having one dark and tarry bowel movement. He states that his pain resolved approximately 45 minutes later and he was symptom free. Patient states that he was transported earlier today from [place] to [place] for second opinion of his right knee pain with [facility]. While at their office, he reported epigastric and chest pain and was noted to have atrial fibrillation with RVR (HR reported to be at 220bpm) and transported to [facility] for further evaluation and management. On arrival to the ED, patient reported to ED staff that he had sickle cell disease, nausea, abdominal pain and right knee pain. On our evaluation, patient continued to endorse epigastric pain, however he states that his current pain is not more severe  than his baseline and that this has been ongoing for years. He states that it "will get better like it always does." Additionally, he endorses progressive, significant worsening of his right knee pain and states that he feels as if his right knee has a fever.  Of note, regarding his epigastric pain and dark vomitus, patient had recent admission to [facility] from 08/05/2020 to 08/09/2020 for two days of melena and hematemesis. During hospitalization, patient was found to have H. Pylori infection, a few small gastric body erosions, a 1cm clean-based ulcer in duodenal bulb but otherwise normal EGD with GI bleed ruled out. He received two doses of IV iron while hospitalized. Following that hospitalization, he was instructed to start taking Pepto-Bismol, metronidazole, pantoprazole, and tetracycline for his H. Pylori and continue taking iron supplementation for his iron deficiency anemia. He reports that he still has several days left of his quadruple therapy.  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 He underwent explant and dynamic antibiotic spacer in April 2021 which did not clear his infection. He underwent second explantation and antibiotic spacer in September 2021 and has been following closely with [facility] clinic. He has been offered above the knee amputation as definitive treatment as he has failed two prior antibiotic spacer placements but presented to [facility] for a second opinion on 08/05/2020. At that visit, patient recommended amputation by their clinic due to continued active prosthetic  joint infection. He set up a third opinion visit with [facility] which was today, however he was unable to complete that visit due to his atrial fibrillation with RVR.  ED Course: On route to ED, patient received aspirin 324mg . On arrival to the ED, patient's blood pressure 106/71, tachycardic to 134, respiratory rate of  19, afebrile and saturating at 99% on room air. Patient's initial EKG revealed sinus tachycardia with PACs, supraventricular bigeminy. Initial labs: CBC with Hb 11.3, MCV 93.9, RDW 18.1; CMP with K 2.9, Na 131, Glu 195; INR 1.1; troponin 4; COVID negative. Initial imaging revealed negative abdominal radiograph and no acute cardiopulmonary disease. Patient received 4mg  IV zofran, 12.5mg  IV diphenhydramine, 1L bolus of NS, 67mcg of IV fentanyl and 5mg  IV prochlorperazine. IMTS consulted to come see patient for consideration of admission.  Medications: Albuterol 86mcg 2 puffs Q4H PRN Amitriptyline 25mg  1-2 tablets daily Bismuth subsalicylate 524mg  total four times daily (end date of 08/21/20) Cholecalciferol 2,000u daily Dicyclomine 10mg  twice daily PRN Epipen 0.3mg  PRN Ferrous sulfate 325mg  daily Fluticasone-salmeterol 250-2mcg 1 puff twice daily Gabapentin 800mg  twice daily Hydrocodone-acetaminophen 5-325mg  Q4H PRN Hydroxyzine 25mg  four times daily PRN Metronidazole 500mg  four times daily (end date of 08/21/2020) Multivitamin daily Ondansetron 4mg  Q4H PRN Pantoprazole 40mg  twice daily Prednisone 5mg  daily Tetracycline 500mg  four times daily (end date of 08/21/2020) Zolpidem 5mg  nightly  Allergies: Allergies as of 08/20/2020 - Review Complete 08/20/2020  Allergen Reaction Noted  . Bee venom Anaphylaxis 08/05/2020  . Guaifenesin & derivatives Hives, Itching, and Rash 04/06/2009  . Peanut (diagnostic) Anaphylaxis 09/15/2019  . Peanut-containing drug products Anaphylaxis 08/20/2020  . Valproic acid Anaphylaxis, Hives, Itching, Swelling, and Rash 04/06/2009  . Vancomycin Anaphylaxis 09/15/2019  . Hydroxyurea Other (See Comments) 06/03/2020  . Penicillin g Hives and Itching 03/09/2020  . Depakote [divalproex sodium] Itching and Rash 04/06/2009  . Dicyclomine Itching and Rash 10/27/2011  . Nitroglycerin Itching, Rash, and Other (See Comments) 10/20/2011  . Propoxyphene Itching and Rash  04/06/2009  . Robitussin (alcohol free) [guaifenesin] Hives, Itching, and Rash 08/20/2020  . Sulfa antibiotics Itching and Rash 09/15/2019  . Sulfamethoxazole-trimethoprim Itching and Rash 07/25/2014  . Tramadol Itching and Rash 10/20/2011   Past Medical History:  Diagnosis Date  . Arthritis   . Asthma   . Emphysema lung ([place])   . Neuropathy    Family History:  Mother: Heart disease Father: Asthma, colon cancer  Brother: Diabetes, cancer, asthma   Social History: Patient lives in [place], [place] with his son. He reports that he is only in [place], [place] today for his orthopedics appointment. He has a history of tobacco use of approximately a half pack per day for thirty years and quit in 2003. Patient reports that he previously used marijuana and alcohol on the weekends, however he quit these substances in 2003.  Review of Systems: A complete ROS was negative except as per HPI.  Physical Exam: Blood pressure (!) 111/47, pulse (!) 151, temperature 98.9 F (37.2 C), temperature source Oral, resp. rate (!) 23, height 5\' 7"  (1.702 m), weight 67.1 kg, SpO2 100 %. Physical Exam Vitals and nursing note reviewed. Exam conducted with a chaperone present.  Constitutional:      General: He is not in acute distress.    Appearance: He is not ill-appearing.  Eyes:     Extraocular Movements: Extraocular movements intact.     Conjunctiva/sclera: Conjunctivae normal.  Cardiovascular:     Rate and Rhythm: Tachycardia present. Rhythm irregular.     Pulses:  Normal pulses.     Heart sounds: Normal heart sounds.  Pulmonary:     Effort: Pulmonary effort is normal. No respiratory distress.     Breath sounds: Normal breath sounds. No wheezing or rales.  Abdominal:     General: Abdomen is flat. Bowel sounds are normal.     Palpations: Abdomen is soft.     Tenderness: There is no abdominal tenderness.  Musculoskeletal:        General: Deformity present. No swelling or tenderness.     Right lower  leg: No edema.     Left lower leg: No edema.     Comments: Right knee in full extension, healed midline surgical excision, no erythema, tenderness or swelling. Increased warmth when compared to left knee  Skin:    General: Skin is warm.     Capillary Refill: Capillary refill takes less than 2 seconds.     Findings: No erythema.  Neurological:     General: No focal deficit present.     Mental Status: He is alert and oriented to person, place, and time. Mental status is at baseline.  Psychiatric:        Attention and Perception: Attention normal.        Mood and Affect: Mood is anxious.        Speech: Speech normal.        Behavior: Behavior normal. Behavior is cooperative.        Thought Content: Thought content normal.        Cognition and Memory: Cognition and memory normal.        Judgment: Judgment normal.    EKG: personally reviewed my interpretation is sinus tachycardia with PVCs.  Assessment & Plan by Problem: Active Problems:   Right knee pain  Jeremy Galvan is a 65 year old man with past medical history significant for H. Pylori infection (currently on quadruple therapy), iron deficiency anemia (on iron supplementation), paroxysmal atrial fibrillation (not anticoagulated), right knee periprosthetic MRSA joint infection, and COPD who presented to [facility] on 08/20/20 for evaluation of abdominal pain and dark-colored vomitus.  #Epigastric pain with episode of dark-colored vomit and stool #H. Pylori infection (on quadruple therapy) Patient reports that his epigastric abdominal pain has returned to baseline and that he has not had additional episodes of vomiting or dark stools since this morning. Patient was noted to be guaiac negative in the ED. Patient's hemoglobin is elevated 1.9g/dl greater than upon discharge from [facility] on 08/09/20. Patient would benefit from continued monitoring to ensure resolution of his symptoms. Patient's dark vomitus and stool may be secondary to iron  and Pepto-Bismol ingestion. He is currently on quadruple therapy with intended end date of 08/21/2020, however he reports that he still has several days of treatment left. -Monitor for signs of recurrence -Consider GI consultation if recurrence -CBC tomorrow morning -Maalox/Mylanta 60mL Q6H PRN -Lidocaine 52mL Q6H PRN -Acetaminophen 650mg  Q6H PRN -Restart quadruple therapy:  -Metronidazole 500mg  four times daily  -Tetracycline 500mg  four times daily  -Pantoprazole 40mg  twice daily  -Bismuth subsalicylate 524mg  total four times daily  #Right knee periprosthetic MRSA infection Patient reports progressively worsening right knee pain over the past several months. He has been following closely with recommendation to obtain above the knee amputation of the right leg due to active infection despite two prior antibiotic spacer placements. Patient was intended to have a third opinion today with [facility], however due to the above and below problem, he was sent to [facility] for evaluation. On  evaluation, he does have increased warmth when compared to the left. He has no appreciable swelling or tenderness. We will begin workup for his right knee infection with ESR, CRP and imaging. We will also contact the on-call orthopedics service for recommendations. -ESR and CRP, compare values to prior which were markedly elevated but trending down -DG Knee Complete 4 views right -Arthrocentesis -CBC tomorrow morning to assess for leukocytosis -Monitor fever curve -Collect blood cultures x2 -Acetaminophen $RemoveBeforeDEI'650mg'[id number]$  Q6H PRN  #Atrial fibrillation with RVR CHA2DS2-VASc 0 without anticoagulation. Gastric ulcer with bleeding and history of anemia. Patient in sinus rhythm since admission. -Cardiac monitoring -Hold anticoagulation  #Hypokalemia, active Patient noted to have potassium of 2.9 on admission. Possibly secondary to his vomiting. -Replete with potassium chloride 29mEq for two doses -Repeat BMP in  morning -Obtain Mg   #VTE ppx: SCDs #Diet: Regular #Code status: Full code #IVF: None #Bowel regimen: Miralax daily PRN  Dispo: Admit patient to Observation with expected length of stay less than 2 midnights.  Signed: Cato Mulligan, MD 08/20/2020, 4:11 PM  Pager: [phone] After 5pm on weekdays and 1pm on weekends: On Call pager: [phone]

## 2020-08-20 NOTE — Progress Notes (Signed)
I was called by Dr. Laural Benes regarding patient's request for a 3rd opinion on his right knee. Per the chart, the patient is being admitted for abdominal pain. Per Dr. Laural Benes, patient has had a chronic right knee infection, and a recent 2nd opinion at [facility] has recommended an AKA. Patient requested a 3rd opinion from [facility]. That on call MD at [facility] did not wish to evaluate patient. I explained to Dr. Laural Benes that although patient's request is reasonable, since this issue is has been present for a year, and since I personally am a spine surgeon, best course of action at this moment would be go ahead and admit him, and I will attempt finding someone that might be better suited to provide the 3rd opinion he is requesting in the morning.

## 2020-08-20 NOTE — ED Triage Notes (Signed)
Was at Orthopedic doctor's office for right knee eval. Pt started having chest pain, sob and afib with RVR on the monitor HR at 220 bpm. Currently at 145-150s bpm. Alert and oriented x 4.

## 2020-08-20 NOTE — ED Notes (Signed)
EMS gave 324mg  Aspirin PO en route to [facility].

## 2020-08-20 NOTE — ED Notes (Signed)
Phlebotomist is coming to draw current labs. Pt is a hard stick and IV does not have a good blood return but flushes fine. Pending labs at this time.

## 2020-08-20 NOTE — ED Notes (Signed)
MD at bedside doing knee arthrocentesis. Pt became sleepy and almost lethargic. Unable to speak in full sentences. Pt has been asking for pain meds since arrival in ED. Last Fentanyl administration was at 1156. Pt was then told by ED provider that he is not getting any pain meds until seen by admitting team. Pt asked for his bag to be placed beside him prior to arthrocentesis. Pt was given Narcan 0.2mg  IVP. Pt started waking up. Now able to speak in full sentences. Alert and oriented x 4. Phlebotomist at bedside to do current lab orders. Continuous cardiac monitoring in place. VS stable. Will continue to monitor.

## 2020-08-20 NOTE — ED Notes (Signed)
IV access attempted X2 without success. Primary RN aware.

## 2020-08-20 NOTE — Progress Notes (Signed)
Patient arrived to unit. Transferred to bed with set up assistance. Patient appears sleepy but answers questions appropriate. Currently LR bolus completing. Respirations even and unlabored. VSS. Telemetry verified. Will report to oncoming nurse.

## 2020-08-20 NOTE — ED Notes (Signed)
Dr.Pickering states it is okay to not draw 2nd troponin. 1st troponin was negative. Pt is a hard stick and has [place] IV in place but won't draw back blood. MD cleared to omit 2nd troponin. Pt continues to demand pain medication but appears comfortable in bed and has stable VS, see documentation. MD aware. No new orders at this time. Continuous cardiac monitoring in place.

## 2020-08-20 NOTE — ED Notes (Addendum)
Phlebotomist attempted to collect remaining blood work orders with no success. Able to collect 1 blood culture set. Multiple attempts made previously with lab draws. Admitting MD paged and waiting for call back.

## 2020-08-20 NOTE — ED Notes (Signed)
Pt is asking for pain medication. Pt appears comfortable on bed. MD notified. Dr.Pickering states pt will have to wait for admitting team regarding pain management. VS stable. Pt continues to endorse 10/10 pain to left chest. MD aware. Continuous cardiac monitoring in place.

## 2020-08-20 NOTE — ED Provider Notes (Signed)
[facility] EMERGENCY DEPARTMENT Provider Note   CSN: [id number] Arrival date & time: 08/20/20  1019     History Chief Complaint  Patient presents with  . ATRIAL FIB RVR    Jeremy Galvan is a 65 y.o. male.  HPI Patient presents from orthopedic surgeon.  Reportedly found to be in A. fib with RVR and having a fast heart rate.  Patient states he developed some pain and feeling bad last night and got a little better and then worsened again today.  Reviewing records appears to have history of atrial fibrillation and also history of recent GI bleeds.  States that he vomited up some blood also.  Reviewing records appears to not be on anticoagulation right now.  States he still feels bad.  No blood in the stool.  Pain is in his chest and epigastric area.  States he also feels that he has swelling behind his right leg.  Has had an infection of his right knee previously    Past Medical History:  Diagnosis Date  . Arthritis   . Asthma   . Emphysema lung ([facility])   . Neuropathy   Patient has had infected right knee prosthesis.  Also has had history of atrial fibrillation.  CHA2DS2-VASc 0 without anticoagulation.  Also has had gastric ulcer with bleeding.  Also history of anemia.  There are no problems to display for this patient.        No family history on file.  Social History   Tobacco Use  . Smoking status: Former Smoker    Types: Cigarettes  . Smokeless tobacco: Never Used  Substance Use Topics  . Alcohol use: Not Currently  . Drug use: Not Currently    Home Medications Prior to Admission medications   Medication Sig Start Date End Date Taking? Authorizing Provider  albuterol (VENTOLIN HFA) 108 (90 Base) MCG/ACT inhaler Inhale 2 puffs into the lungs every 6 (six) hours as needed for wheezing or shortness of breath.   Yes [provider]  amitriptyline (ELAVIL) 25 MG tablet Take 25 mg by mouth in the morning and at bedtime.   Yes [provider]  aspirin 325 MG EC tablet Take 325 mg by mouth daily.   Yes [provider]  ELIQUIS 5 MG TABS tablet Take 5 mg by mouth 2 (two) times daily.   Yes [provider]    Allergies    Bee venom, Guaifenesin & derivatives, Peanut (diagnostic), Peanut-containing drug products, Valproic acid, Vancomycin, Hydroxyurea, Penicillin g, Robitussin (alcohol free) [guaifenesin], Depakote [divalproex sodium], Dicyclomine, Nitroglycerin, Propoxyphene, Sulfa antibiotics, Sulfamethoxazole-trimethoprim, and Tramadol  Review of Systems   Review of Systems  Constitutional: Positive for appetite change.  HENT: Negative for congestion.   Respiratory: Positive for shortness of breath.   Cardiovascular: Positive for chest pain.  Gastrointestinal: Positive for abdominal pain, nausea and vomiting.  Genitourinary: Negative for flank pain.  Musculoskeletal: Negative for back pain.  Skin: Negative for rash.  Neurological: Negative for weakness.  Psychiatric/Behavioral: Negative for confusion.    Physical Exam Updated Vital Signs BP 123/65   Pulse (!) 104   Temp 99.2 F (37.3 C) (Oral)   Resp (!) 23   Ht 5\' 7"  (1.702 m)   Wt 67.1 kg   SpO2 99%   BMI 23.18 kg/m   Physical Exam Vitals and nursing note reviewed.  Constitutional:      Appearance: Normal appearance.  HENT:     Head: Atraumatic.  Eyes:  Pupils: Pupils are equal, round, and reactive to light.  Cardiovascular:     Rate and Rhythm: Tachycardia present. Rhythm irregular.  Pulmonary:     Breath sounds: No wheezing or rhonchi.  Abdominal:     Tenderness: There is abdominal tenderness.     Comments: Epigastric tenderness without rebound or guarding.  Musculoskeletal:     Comments: Mild tenderness in right calf area.  No fullness felt.  Skin:    General: Skin is warm.     Capillary Refill: Capillary refill takes less than 2 seconds.  Neurological:     Mental Status: He is alert and oriented to person,  place, and time.     ED Results / Procedures / Treatments   Labs (all labs ordered are listed, but only abnormal results are displayed) Labs Reviewed  COMPREHENSIVE METABOLIC PANEL - Abnormal; Notable for the following components:      Result Value   Sodium 131 (*)    Potassium 2.9 (*)    CO2 20 (*)    Glucose, Bld 195 (*)    Calcium 8.5 (*)    Albumin 2.9 (*)    All other components within normal limits  CBC WITH DIFFERENTIAL/PLATELET - Abnormal; Notable for the following components:   RBC 3.94 (*)    Hemoglobin 11.3 (*)    HCT 37.0 (*)    RDW 18.1 (*)    All other components within normal limits  RESP PANEL BY RT-PCR (FLU A&B, COVID) ARPGX2  PROTIME-INR  SAMPLE TO BLOOD BANK  TROPONIN I (HIGH SENSITIVITY)  TROPONIN I (HIGH SENSITIVITY)    EKG EKG Interpretation  Date/Time:  Thursday August 20 2020 10:25:34 EDT Ventricular Rate:  129 PR Interval:  141 QRS Duration: 85 QT Interval:  281 QTC Calculation: 412 R Axis:   79 Text Interpretation: Sinus tach with PVC Supraventricular bigeminy LVH with secondary repolarization abnormality Confirmed by Benjiman Core [phone]) on 08/20/2020 10:31:22 AM   Radiology DG Abdomen Acute W/Chest  Result Date: 08/20/2020 CLINICAL DATA:  Abdominal and chest pain. EXAM: DG ABDOMEN ACUTE WITH 1 VIEW CHEST COMPARISON:  01/08/2011 FINDINGS: Heart size and mediastinal contours are unremarkable. There is no pleural effusion or edema identified. No airspace densities identified. No dilated loops of large or small bowel. Previous bilateral hip arthroplasty. IMPRESSION: Negative abdominal radiographs.  No acute cardiopulmonary disease. Electronically Signed   By: Signa Kell M.D.   On: 08/20/2020 13:32    Procedures Procedures   Medications Ordered in ED Medications  prochlorperazine (COMPAZINE) injection 5 mg (has no administration in time range)  sodium chloride 0.9 % bolus 1,000 mL (0 mLs Intravenous Stopped 08/20/20 1333)   ondansetron (ZOFRAN) injection 4 mg (4 mg Intravenous Given 08/20/20 1059)  diphenhydrAMINE (BENADRYL) injection 12.5 mg (12.5 mg Intravenous Given 08/20/20 1102)  fentaNYL (SUBLIMAZE) injection 50 mcg (50 mcg Intravenous Given 08/20/20 1156)    ED Course  I have reviewed the triage vital signs and the nursing notes.  Pertinent labs & imaging results that were available during my care of the patient were reviewed by me and considered in my medical decision making (see chart for details).    MDM Rules/Calculators/A&P                          Patient with nausea abdominal pain.  Recent admission for same.  Had GI bleeding at that time.  Required iron.  States he has vomited up blood today although he was guaiac  negative.  Hemoglobin reassuring but recently has had hemoglobin down to 7.  Had told the nurse that he had sickle cell.  Reviewing records it appears this has been proven he does not have it several times including most recent visit.  Does probably have some chronic pain however to particularly in his right knee.  Did have episode of tachycardia now appears to be in a sinus rhythm but initially had a rate that was fast and potentially could have been A. fib with an RVR.  However history of A. fib and CHA2DS2-VASc 0 not on anticoagulation.  However with continued tachycardia recent GI bleeds and the reported vomiting of blood I feels the patient benefit from observation in the hospital.  Will discuss with unassigned medicine. Final Clinical Impression(s) / ED Diagnoses Final diagnoses:  Pain of upper abdomen  Nausea and vomiting, intractability of vomiting not specified, unspecified vomiting type  Atrial fibrillation with rapid ventricular response [facility])    Rx / DC Orders ED Discharge Orders    None       Benjiman Core, MD 08/20/20 1338

## 2020-08-21 ENCOUNTER — Other Ambulatory Visit: Payer: Self-pay

## 2020-08-21 ENCOUNTER — Encounter (HOSPITAL_COMMUNITY): Payer: Self-pay | Admitting: Internal Medicine

## 2020-08-21 DIAGNOSIS — I4891 Unspecified atrial fibrillation: Secondary | ICD-10-CM | POA: Diagnosis not present

## 2020-08-21 DIAGNOSIS — T8450XA Infection and inflammatory reaction due to unspecified internal joint prosthesis, initial encounter: Secondary | ICD-10-CM

## 2020-08-21 DIAGNOSIS — T8453XA Infection and inflammatory reaction due to internal right knee prosthesis, initial encounter: Secondary | ICD-10-CM

## 2020-08-21 DIAGNOSIS — M00061 Staphylococcal arthritis, right knee: Secondary | ICD-10-CM | POA: Diagnosis not present

## 2020-08-21 DIAGNOSIS — M25561 Pain in right knee: Secondary | ICD-10-CM | POA: Diagnosis not present

## 2020-08-21 LAB — BASIC METABOLIC PANEL
Anion gap: 6 (ref 5–15)
BUN: 9 mg/dL (ref 8–23)
CO2: 22 mmol/L (ref 22–32)
Calcium: 8.2 mg/dL — ABNORMAL LOW (ref 8.9–10.3)
Chloride: 104 mmol/L (ref 98–111)
Creatinine, Ser: 0.82 mg/dL (ref 0.61–1.24)
GFR, Estimated: >60 mL/min (ref >60)
Glucose, Bld: 110 mg/dL — ABNORMAL HIGH (ref 70–99)
Potassium: 4.1 mmol/L (ref 3.5–5.1)
Sodium: 132 mmol/L — ABNORMAL LOW (ref 135–145)

## 2020-08-21 LAB — CBC
HCT: 29.6 % — ABNORMAL LOW (ref 39.0–52.0)
Hemoglobin: 9.3 g/dL — ABNORMAL LOW (ref 13.0–17.0)
MCH: 28.7 pg (ref 26.0–34.0)
MCHC: 31.4 g/dL (ref 30.0–36.0)
MCV: 91.4 fL (ref 80.0–100.0)
Platelets: 218 10*3/uL (ref 150–400)
RBC: 3.24 MIL/uL — ABNORMAL LOW (ref 4.22–5.81)
RDW: 18.3 % — ABNORMAL HIGH (ref 11.5–15.5)
WBC: 3.8 10*3/uL — ABNORMAL LOW (ref 4.0–10.5)
nRBC: 0 % (ref 0.0–0.2)

## 2020-08-21 LAB — HIV ANTIBODY (ROUTINE TESTING W REFLEX): HIV Screen 4th Generation wRfx: NONREACTIVE

## 2020-08-21 LAB — SEDIMENTATION RATE: Sed Rate: 22 mm/hr — ABNORMAL HIGH (ref 0–16)

## 2020-08-21 LAB — C-REACTIVE PROTEIN: CRP: 3.4 mg/dL — ABNORMAL HIGH (ref <1.0)

## 2020-08-21 MED ORDER — HYDROCODONE-ACETAMINOPHEN 5-325 MG PO TABS: 1.0000 | ORAL_TABLET | Freq: Four times a day (QID) | ORAL | 0 refills | Status: DC | PRN

## 2020-08-21 MED ORDER — BISMUTH SUBSALICYLATE 262 MG PO CHEW: 524.0000 mg | CHEWABLE_TABLET | Freq: Three times a day (TID) | ORAL | 0 refills | Status: AC

## 2020-08-21 MED ORDER — PANTOPRAZOLE SODIUM 40 MG PO TBEC: 40.0000 mg | DELAYED_RELEASE_TABLET | Freq: Two times a day (BID) | ORAL | 0 refills | Status: AC

## 2020-08-21 MED ORDER — HYDROXYZINE HCL 25 MG PO TABS: 25.0000 mg | ORAL_TABLET | Freq: Once | ORAL | Status: DC

## 2020-08-21 MED ORDER — HYDROXYZINE HCL 25 MG PO TABS: 25.0000 mg | ORAL_TABLET | Freq: Every evening | ORAL | 0 refills | Status: AC

## 2020-08-21 MED ORDER — HYDROCODONE-ACETAMINOPHEN 5-325 MG PO TABS
1.0000 | ORAL_TABLET | Freq: Four times a day (QID) | ORAL | Status: DC | PRN
Start: 2020-08-21 — End: 2020-08-22
  Administered 2020-08-21: 1 via ORAL
  Filled 2020-08-21: qty 1

## 2020-08-21 MED ORDER — METRONIDAZOLE 500 MG PO TABS: 500.0000 mg | ORAL_TABLET | Freq: Four times a day (QID) | ORAL | 0 refills | Status: AC

## 2020-08-21 MED ORDER — TETRACYCLINE HCL 500 MG PO CAPS: 500.0000 mg | ORAL_CAPSULE | Freq: Four times a day (QID) | ORAL | 0 refills | Status: AC

## 2020-08-21 MED ORDER — ONDANSETRON HCL 4 MG PO TABS
4.0000 mg | ORAL_TABLET | Freq: Once | ORAL | Status: AC
  Administered 2020-08-21: 4 mg via ORAL
  Filled 2020-08-21: qty 1

## 2020-08-21 NOTE — Procedures (Signed)
After consent was obtained, using sterile technique the right lateral knee was prepped and plain Lidocaine 1% was used as local anesthetic. The joint was entered, however, no fluid could be withdrawn. The procedure was well tolerated.   Dr. Verdene Lennert Internal Medicine PGY-2  Pager: [phone] After 5pm on weekdays and 1pm on weekends: On Call pager [phone]  08/21/2020, 5:27 PM

## 2020-08-21 NOTE — Discharge Summary (Signed)
Name: Jeremy Galvan MRN: [id number] DOB: 04/03/1956 65 y.o. PCP: Pcp, No  Date of Admission: 08/20/2020 10:19 AM Date of Discharge: 08/21/2020 Attending Physician: Velna Ochs, MD  Discharge Diagnosis: 1. Active Problems:   Right knee pain   Prosthetic joint infection [facility])  Discharge Medications: Allergies as of 08/21/2020      Reactions   Bee Venom Anaphylaxis   Guaifenesin & Derivatives Hives, Itching, Rash      Peanut (diagnostic) Anaphylaxis   Peanut-containing Drug Products Anaphylaxis   Valproic Acid Anaphylaxis, Hives, Itching, Swelling, Rash   Vancomycin Anaphylaxis   Hydroxyurea Other (See Comments)   Sickle Cell Disease diagnosis has been ruled out after review of Hemoglobin electrophoresis results   Penicillin G Hives, Itching   Severe itching and bumps   Depakote [divalproex Sodium] Itching, Rash   Dicyclomine Itching, Rash      Nitroglycerin Itching, Rash, Other (See Comments)   Headaches, also   Propoxyphene Itching, Rash   Robitussin (alcohol Free) [guaifenesin] Hives, Itching, Rash   Sulfa Antibiotics Itching, Rash   Sulfamethoxazole-trimethoprim Itching, Rash   Tramadol Itching, Rash         Medication List    STOP taking these medications   aspirin 325 MG EC tablet   cyclobenzaprine 10 MG tablet Commonly known as: FLEXERIL   diphenhydrAMINE 25 mg capsule Commonly known as: BENADRYL   folic acid 1 MG tablet Commonly known as: FOLVITE   HYDROcodone-acetaminophen 10-325 MG tablet Commonly known as: NORCO Replaced by: HYDROcodone-acetaminophen 5-325 MG tablet   methocarbamol 500 MG tablet Commonly known as: ROBAXIN   metoprolol tartrate 25 MG tablet Commonly known as: LOPRESSOR   naloxone 4 MG/0.1ML Liqd nasal spray kit Commonly known as: NARCAN   ondansetron 4 MG tablet Commonly known as: ZOFRAN   potassium chloride SA 20 MEQ tablet Commonly known as: KLOR-CON   predniSONE 5 MG tablet Commonly known as: DELTASONE    sennosides-docusate sodium 8.6-50 MG tablet Commonly known as: SENOKOT-S   Tussionex Pennkinetic ER 10-8 MG/5ML Suer Generic drug: chlorpheniramine-HYDROcodone     TAKE these medications   albuterol 108 (90 Base) MCG/ACT inhaler Commonly known as: VENTOLIN HFA Inhale 2 puffs into the lungs every 6 (six) hours as needed for wheezing or shortness of breath.   amitriptyline 25 MG tablet Commonly known as: ELAVIL Take 25 mg by mouth in the morning and at bedtime.   bismuth subsalicylate 956 MG chewable tablet Commonly known as: PEPTO BISMOL Chew 2 tablets (524 mg total) by mouth 4 (four) times daily -  before meals and at bedtime.   citalopram 10 MG tablet Commonly known as: CELEXA Take 10 mg by mouth in the morning.   dicyclomine 10 MG capsule Commonly known as: BENTYL Take 10 mg by mouth 2 (two) times daily.   EPINEPHrine 0.3 mg/0.3 mL Soaj injection Commonly known as: EPI-PEN Inject 0.3 mg into the muscle as needed for anaphylaxis.   ferrous sulfate 325 (65 FE) MG tablet Take 325 mg by mouth daily with breakfast.   gabapentin 800 MG tablet Commonly known as: NEURONTIN Take 800 mg by mouth 2 (two) times daily.   HYDROcodone-acetaminophen 5-325 MG tablet Commonly known as: NORCO/VICODIN Take 1 tablet by mouth every 6 (six) hours as needed for severe pain. Replaces: HYDROcodone-acetaminophen 10-325 MG tablet   hydrOXYzine 25 MG tablet Commonly known as: ATARAX/VISTARIL Take 1 tablet (25 mg total) by mouth at bedtime. What changed: when to take this   metroNIDAZOLE 500 MG tablet Commonly known  as: FLAGYL Take 1 tablet (500 mg total) by mouth 4 (four) times daily.   pantoprazole 40 MG tablet Commonly known as: PROTONIX Take 1 tablet (40 mg total) by mouth 2 (two) times daily. What changed: when to take this   tamsulosin 0.4 MG Caps capsule Commonly known as: FLOMAX Take 0.4 mg by mouth daily.   tetracycline 500 MG capsule Commonly known as: SUMYCIN Take 1  capsule (500 mg total) by mouth 4 (four) times daily.   zolpidem 5 MG tablet Commonly known as: AMBIEN Take 5 mg by mouth at bedtime.     Disposition and follow-up:   Mr.Jeremy Galvan was discharged from [facility] in Stable condition.  At the hospital follow up visit please address:  1.   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 Dr. Sharol Given evaluated patient while admitted and will follow-up in one week. Discussed with ID who feels that patient will likely need above the knee amputation and additional hardware are likely to be re-infected. Please discuss options with patient for treatment and consider repeating inflammatory markers.  #H. Pylori infection (on quadruple therapy) #Gastric ulcers and duodenal ulcer Patient reports that he took quadruple therapy with only missing 3-4 days prior to arrival. He received quadruple therapy for two days while admitted and provided additional five day course of quadruple therapy upon discharge. Assess patient's symptoms and determine whether he would benefit from test of cure. Please determine if patient would benefit from gastroenterology referral.  #Paroxysmal atrial fibrillation Patient reportedly in atrial fibrillation with RVR at orthopedics office, however in sinus tachycardia upon arrival which improved with IVF. Please assess status of condition and recommend continued follow-up with outpatient cardiologist.  2.  Labs / imaging needed at time of follow-up: CBC, possibly ESR/CRP, possibly repeat H. Pylori test in 2 weeks off PPI  3.  Pending labs/ test needing follow-up: None  Follow-up Appointments:  Follow-up Information    Newt Minion, MD Follow up in 1 week(s).   Specialty: Orthopedic Surgery Contact information: [place]  [address] [phone]        Keane Police, MD. Schedule an appointment as soon as possible for a visit in 1 week(s).   Specialty: Family Medicine Contact information: [address] [place] [address] [place] [facility] Course by problem list:  #Paroxysmal atrial fibrillation, chronic Patient initially presented to hospital from orthopedics office where he was found to be in atrial fibrillation with RVR (>200). Upon arrival to the ED, patient in sinus rhythm although tachycardic. patient's CHA2DS2-VASc 0 without anticoagulation. Patient continued to remain in normal sinus rhythm throughout admission.  #Right knee periprosthetic MRSA infection Patient presented with significant right knee pain which was stable from baseline. 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 Patient had repeat inflammatory markers collected which showed significant improvement when compared to prior markers three weeks before admission. Orthopedics was consulted and Dr. Sharol Given came to evaluate patient at bedside with recommendation to continue to follow patient in outpatient setting. Discussed case with ID who strongly advises against placement of additional  hardware as this is likely to be reinfected. Although patient does not wish to hear this recommendation, ID agrees with above the knee amputation.  #H. Pylori infection (on quadruple therapy) #Gastric ulcers and duodenal ulcer Patient presented following an episode of 9/10 epigastric pain at home with one episode of vomiting black material and one dark tarry bowel movement. Patient had resolution of these symptoms with supportive care overnight and hemoglobin relatively stable in the setting of his dilution  from receiving 2L IVF. Patient received quadruple therapy for his H. Pylori (metronidazole, tetracycline, bismuth subsalicylate and pantoprazole) while hospitalized. Patient does not have established gastroenterologist, however patient has received new PCP, Dr. Marygrace Drought, after obtaining orange card. Following last hospitalization, patient recommended to follow-up with PCP and assess need for gastroenterology referral. Patient provided five additional days of quadruple therapy upon discharge.  #Hypokalemia Patient's potassium of 2.9 on admission improved to 4.1 this morning following repletion yesterday.  Pertinent Labs, Studies, and Procedures:  CBC Latest Ref Rng & Units 08/21/2020 08/20/2020 01/10/2011  WBC 4.0 - 10.5 K/uL 3.8(L) 6.6 7.3  Hemoglobin 13.0 - 17.0 g/dL 9.3(L) 11.3(L) 10.3(L)  Hematocrit 39.0 - 52.0 % 29.6(L) 37.0(L) 31.3(L)  Platelets 150 - 400 K/uL 218 303 222   BMP Latest Ref Rng & Units 08/21/2020 08/20/2020 01/09/2011  Glucose 70 - 99 mg/dL 110(H) 195(H) 79  BUN 8 - 23 mg/dL $Remove'9 10 11  'JCSqDkB$ Creatinine 0.61 - 1.24 mg/dL 0.82 1.12 0.72  Sodium 135 - 145 mmol/L 132(L) 131(L) 136  Potassium 3.5 - 5.1 mmol/L 4.1 2.9(L) 3.7  Chloride 98 - 111 mmol/L 104 100 105  CO2 22 - 32 mmol/L 22 20(L) 22  Calcium 8.9 - 10.3 mg/dL 8.2(L) 8.5(L) 8.7   CRP - 3.4 ESR - 22 Mg - 1.7 HIV - non reactive  DG Knee Complete 4 Views Right  Result Date: 08/20/2020 CLINICAL DATA:  Prosthesis infection EXAM: RIGHT KNEE - COMPLETE 4+ VIEW COMPARISON:  None. FINDINGS: Frontal, bilateral oblique, lateral views of the right knee are obtained. Methylmethacrylate and spacer device are seen spanning the joint space of the right knee. Alignment is grossly anatomic. No joint effusion. Soft tissues are unremarkable. IMPRESSION: 1. Methylmethacrylate and spacer spanning the right knee joint space. Electronically Signed   By: Randa Ngo M.D.   On: 08/20/2020 17:59   DG Abdomen Acute W/Chest  Result Date:  08/20/2020 CLINICAL DATA:  Abdominal and chest pain. EXAM: DG ABDOMEN ACUTE WITH 1 VIEW CHEST COMPARISON:  01/08/2011 FINDINGS: Heart size and mediastinal contours are unremarkable. There is no pleural effusion or edema identified. No airspace densities identified. No dilated loops of large or small bowel. Previous bilateral hip arthroplasty. IMPRESSION: Negative abdominal radiographs.  No acute cardiopulmonary disease. Electronically Signed   By: Kerby Moors M.D.   On: 08/20/2020 13:32   Discharge Instructions: Discharge Instructions    Call MD for:  difficulty breathing, headache or visual disturbances   Complete by: As directed    Call MD for:  extreme fatigue   Complete by: As directed    Call MD for:  hives   Complete by: As directed    Call MD for:  persistant dizziness or light-headedness   Complete by: As directed    Call MD for:  persistant nausea and vomiting   Complete by: As directed    Call MD for:  redness, tenderness, or signs of infection (pain, swelling, redness, odor or green/yellow discharge around incision site)   Complete by: As directed  Call MD for:  severe uncontrolled pain   Complete by: As directed    Call MD for:  temperature >100.4   Complete by: As directed    Diet - low sodium heart healthy   Complete by: As directed    Discharge instructions   Complete by: As directed    Mr. Glace,  It was a pleasure taking care of you during your recent admission.  For your right knee pain: It is very important that you follow-up with orthopedics, Dr. Sharol Given, in one week to discuss your options for managing this problem. I have prescribed a very short course of medication to help with pain, you can take this as needed up to three times in a day to help with the pain while waiting on your appointment with orthopedics.  For your abdominal pain: It is very important that you finish out treatment for your H. Pylori infection. You will need to take metronidazole four times  a day, tetracycline four times a day, Pepto Bismol four times a day and protonix twice daily until you run out of the medications. It will be important to discuss this condition with your primary care doctor, Dr. Franchot Heidelberg.  Sincerely, Paulla Dolly, MD   Increase activity slowly   Complete by: As directed      Signed: Cato Mulligan, MD 08/21/2020, 4:04 PM   Pager: [phone]

## 2020-08-21 NOTE — Plan of Care (Signed)

## 2020-08-21 NOTE — Progress Notes (Signed)
CSW received request for transportation home. Ride arranged with [facility] transportation; driver Fayrene Fearing) is in a blue [place] arriving at [employer].  Jeremy Orrego LCSW

## 2020-08-21 NOTE — Progress Notes (Signed)
Subjective:   Overnight, no acute events.  This morning, patient reports that he feels much better than on admission with resolution of his weakness. He denies abdominal pain, nausea, or vomiting. He denies any dark or tarry bowel movements. He does report that his right knee continues to hurt significantly and that it feels warm. He is very concerned about the idea of having a right lower extremity amputation and is hopeful for alternative options. We discussed with patient that we are having an orthopedic surgeon come to evaluate him at bedside to discuss options for management moving forward.  Objective:  Vital signs in last 24 hours: Vitals:   08/20/20 1802 08/20/20 1903 08/20/20 2351 08/21/20 0443  BP: 95/71 108/62 110/71 103/70  Pulse: 84 94 73 96  Resp: $Remo'15 15 20 20  'TGRYI$ Temp: 98.9 F (37.2 C) 98.9 F (37.2 C) 98.8 F (37.1 C) 98.6 F (37 C)  TempSrc: Oral Oral Oral Axillary  SpO2: 100% 97% 97% 100%  Weight:      Height:      On room air  Intake/Output Summary (Last 24 hours) at 08/21/2020 1105 Last data filed at 08/21/2020 0813 Gross per 24 hour  Intake 2720.85 ml  Output 1100 ml  Net 1620.85 ml   Filed Weights   08/20/20 1046  Weight: 67.1 kg   Physical Exam Vitals and nursing note reviewed.  Constitutional:      General: He is not in acute distress.    Appearance: He is not ill-appearing.  Cardiovascular:     Rate and Rhythm: Normal rate and regular rhythm.     Pulses: Normal pulses.     Heart sounds: Normal heart sounds.  Pulmonary:     Effort: Pulmonary effort is normal. No respiratory distress.     Breath sounds: Normal breath sounds.  Abdominal:     General: Abdomen is flat. Bowel sounds are normal.     Palpations: Abdomen is soft.     Tenderness: There is no abdominal tenderness.  Musculoskeletal:     Comments: Right lower extremity with knee in full extension. Well-healed midline surgical scar. No tenderness to light palpation. No appreciable swelling.   Skin:    Capillary Refill: Capillary refill takes less than 2 seconds.     Comments: Increased warmth of the right knee compared to the left. No erythema  Neurological:     General: No focal deficit present.     Mental Status: He is alert. Mental status is at baseline.  Psychiatric:        Mood and Affect: Mood normal.        Behavior: Behavior normal.        Thought Content: Thought content normal.        Judgment: Judgment normal.   Labs since admission: CBC Latest Ref Rng & Units 08/21/2020 08/20/2020 01/10/2011  WBC 4.0 - 10.5 K/uL 3.8(L) 6.6 7.3  Hemoglobin 13.0 - 17.0 g/dL 9.3(L) 11.3(L) 10.3(L)  Hematocrit 39.0 - 52.0 % 29.6(L) 37.0(L) 31.3(L)  Platelets 150 - 400 K/uL 218 303 222   BMP Latest Ref Rng & Units 08/21/2020 08/20/2020 01/09/2011  Glucose 70 - 99 mg/dL 110(H) 195(H) 79  BUN 8 - 23 mg/dL $Remove'9 10 11  'TbZyCfE$ Creatinine 0.61 - 1.24 mg/dL 0.82 1.12 0.72  Sodium 135 - 145 mmol/L 132(L) 131(L) 136  Potassium 3.5 - 5.1 mmol/L 4.1 2.9(L) 3.7  Chloride 98 - 111 mmol/L 104 100 105  CO2 22 - 32 mmol/L 22 20(L) 22  Calcium  8.9 - 10.3 mg/dL 8.2(L) 8.5(L) 8.7  Magnesium - 1.7  CRP - 3.4 (55.9 on 07/27/2020) ESR - 22 (>130 on 07/27/2020) Blood cultures collected on 03/31 - pending  Imaging since admission: DG Knee Complete 4 Views Right Result Date: 08/20/2020 IMPRESSION: 1. Methylmethacrylate and spacer spanning the right knee joint space.  Assessment/Plan:  Active Problems:   Right knee pain  Jeremy Galvan is a 65 year old man with past medical history significant for H. Pylori infection (currently on quadruple therapy), iron deficiency anemia (on iron supplementation), paroxysmal atrial fibrillation (not anticoagulated), and right knee periprosthetic MRSA joint infection who presented to [facility] 08/20/20 for evaluation of abdominal pain in the setting of hematemesis found to be in atrial fibrillation with RVR.  #Right knee periprosthetic MRSA infection, chronic Patient continues  to endorse significant pain and warmth in his right knee despite acetaminophen. He is hemodynamically stable overnight with no fevers or leukocytosis. His inflammatory markers show significant improvement from prior collection three weeks ago. His radiograph was unremarkable. Orthopedics has been consulted with plan for Dr. Sharol Given to come evaluate the patient at bedside and provide recommendations for management. Patient has been previously offered an above-the-knee amputation by two different orthopedic surgeons at [facility] and [facility], however patient is not ready for this intervention per our discussion. -Orthopedics consulted, greatly appreciate recommendations  -Acetaminophen 650mg  Q6H PRN  -Start hydrocodone-acetaminophen 5-325mg  1 tablet Q6H PRN -Follow-up blood cultures  #H. Pylori infection (on quadruple therapy), active #Gastric ulcers and duodenal ulcer, active Patient's abdominal pain and nausea have improved completely since admission. He has had no further episodes of hematemesis or melena since admission. His hemoglobin is down slightly since admission, however it has returned to baseline from 11 days prior to admission in the setting of IVF. Decrease in hemoglobin likely dilutional. Patient would benefit from continuation of quadruple therapy and close follow-up with PCP at [facility] and referral to gastroenterology. -Monitor for signs of recurrence -Continue quadruple therapy (metronidazole, tetracycline, bismuth subsalicylate, and pantoprazole) -Follow-up with PCP at [facility] for repeat hemoglobin, evaluation of GI symptoms and referral to gastroenterology  #Paroxysmal atrial fibrillation, chronic CHA2DS2-VASc 0 without anticoagulation. Patient continues to remain in normal sinus rhythm since admission. -Cardiac monitoring -Continue holding anticoagulation  #Hypokalemia, resolved Patient's potassium of 2.9 on admission improved to 4.1 this morning following  repletion yesterday.  #VTE ppx: SCDs #Diet: Regular #Code status: Full code #IVF: None #Bowel regimen: Miralax daily PRN  Prior to Admission Living Arrangement: Home Anticipated Discharge Location: Home Barriers to Discharge: Orthopedics evaluation Dispo: Anticipated discharge in approximately 0-1 day(s).   Cato Mulligan, MD 08/21/2020, 11:05 AM Pager: [phone] After 5pm on weekdays and 1pm on weekends: On Call pager [phone]

## 2020-08-21 NOTE — Consult Note (Signed)
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  HPI: Jeremy Galvan is a 65 y.o. male who presents with antibiotic spacer status post infected right total knee arthroplasty.  Patient states that he underwent a total knee arthroplasty on the right about 5 months later he was diagnosed with an infected joint he underwent placement of an antibiotic spacer that was anatomic.  Patient states he was also on a PICC line for IV antibiotics.  He states he subsequently had this revised to a nonanatomic antibiotic spacer he presents at this time for consideration for limb salvage.  Past Medical History:  Diagnosis Date  . Arthritis   . Asthma   . Emphysema lung (HCC)   . Neuropathy     Social History   Socioeconomic History  . Marital status: Legally Separated    Spouse name: Not on file  . Number of children: Not on file  . Years of education: Not on file  . Highest education level: Not on file  Occupational History  . Not on file  Tobacco Use  . Smoking status: Former Smoker    Types: Cigarettes  . Smokeless tobacco: Never Used  Vaping Use  . Vaping Use: Not on file  Substance and Sexual Activity  . Alcohol use: Not Currently  . Drug use: Not Currently  . Sexual activity: Not Currently  Other Topics Concern  . Not on file  Social History Narrative  . Not on file   Social Determinants of Health   Financial Resource Strain: Not on file  Food Insecurity: Not on file  Transportation Needs: Not on file  Physical Activity: Not on file  Stress: Not on file  Social Connections: Not on file   No family history on file. - negative except otherwise stated in the family history section Allergies  Allergen Reactions  . Bee Venom Anaphylaxis  . Guaifenesin & Derivatives Hives, Itching and Rash       . Peanut (Diagnostic) Anaphylaxis  . Peanut-Containing Drug  Products Anaphylaxis  . Valproic Acid Anaphylaxis, Hives, Itching, Swelling and Rash  . Vancomycin Anaphylaxis  . Hydroxyurea Other (See Comments)    Sickle Cell Disease diagnosis has been ruled out after review of Hemoglobin electrophoresis results  . Penicillin G Hives and Itching    Severe itching and bumps  . Depakote [Divalproex Sodium] Itching and Rash  . Dicyclomine Itching and Rash       . Nitroglycerin Itching, Rash and Other (See Comments)    Headaches, also  . Propoxyphene Itching and Rash  . Robitussin (Alcohol Free) [Guaifenesin] Hives, Itching and Rash  . Sulfa Antibiotics Itching and Rash  . Sulfamethoxazole-Trimethoprim Itching and Rash  . Tramadol Itching and Rash        Prior to Admission medications   Medication Sig Start Date End Date Taking? Authorizing Provider  albuterol (VENTOLIN HFA) 108 (90 Base) MCG/ACT inhaler Inhale 2 puffs into the lungs every 6 (six) hours as needed for wheezing or shortness of breath.   Yes [provider]  amitriptyline (ELAVIL) 25 MG tablet Take 25 mg by mouth in the morning and at bedtime.   Yes [provider]  aspirin 325 MG EC tablet Take 325 mg by mouth daily.   Yes [provider]  chlorpheniramine-HYDROcodone (TUSSIONEX PENNKINETIC ER) 10-8 MG/5ML SUER Take 5 mLs by mouth 2 (two) times daily as needed for cough.   Yes [provider]  citalopram (CELEXA) 10 MG tablet Take 10 mg by mouth in the morning.   Yes [provider]  cyclobenzaprine (FLEXERIL) 10 MG tablet Take 10 mg by mouth 3 (three) times daily as needed for muscle spasms.   Yes [provider]  dicyclomine (BENTYL) 10 MG capsule Take 10 mg by mouth 2 (two) times daily.   Yes [provider]  diphenhydrAMINE (BENADRYL) 25 mg capsule Take 25 mg by mouth every 6 (six) hours as needed for itching.   Yes [provider]  EPINEPHrine 0.3 mg/0.3 mL IJ SOAJ injection Inject 0.3 mg into the muscle as  needed for anaphylaxis.   Yes [provider]  ferrous sulfate 325 (65 FE) MG tablet Take 325 mg by mouth daily with breakfast.   Yes [provider]  folic acid (FOLVITE) 1 MG tablet Take 1 mg by mouth daily.   Yes [provider]  gabapentin (NEURONTIN) 800 MG tablet Take 800 mg by mouth 2 (two) times daily.   Yes [provider]  HYDROcodone-acetaminophen (NORCO) 10-325 MG tablet Take 1 tablet by mouth in the morning and at bedtime.   Yes [provider]  hydrOXYzine (ATARAX/VISTARIL) 25 MG tablet Take 25 mg by mouth 2 (two) times daily.   Yes [provider]  methocarbamol (ROBAXIN) 500 MG tablet Take 500 mg by mouth every 8 (eight) hours as needed for muscle spasms.   Yes [provider]  metoprolol tartrate (LOPRESSOR) 25 MG tablet Take 25 mg by mouth in the morning.   Yes [provider]  naloxone (NARCAN) nasal spray 4 mg/0.1 mL Place 1 spray into the nose once as needed (as directed and call 9-1-1; may repeat once after 2-3 minutes using a new nasal spray).   Yes [provider]  ondansetron (ZOFRAN) 4 MG tablet Take 4 mg by mouth every 8 (eight) hours as needed for nausea or vomiting.   Yes [provider]  pantoprazole (PROTONIX) 40 MG tablet Take 40 mg by mouth daily before breakfast.   Yes [provider]  potassium chloride SA (KLOR-CON) 20 MEQ tablet Take 20 mEq by mouth daily.   Yes [provider]  predniSONE (DELTASONE) 5 MG tablet Take 5 mg by mouth daily with breakfast.   Yes [provider]  sennosides-docusate sodium (SENOKOT-S) 8.6-50 MG tablet Take 1 tablet by mouth 2 (two) times daily.   Yes [provider]  tamsulosin (FLOMAX) 0.4 MG CAPS capsule Take 0.4 mg by mouth daily.   Yes [provider]  zolpidem (AMBIEN) 5 MG tablet Take 5 mg by mouth at bedtime.   Yes [provider]   DG Knee Complete 4 Views Right  Result Date:  08/20/2020 CLINICAL DATA:  Prosthesis infection EXAM: RIGHT KNEE - COMPLETE 4+ VIEW COMPARISON:  None. FINDINGS: Frontal, bilateral oblique, lateral views of the right knee are obtained. Methylmethacrylate and spacer device are seen spanning the joint space of the right knee. Alignment is grossly anatomic. No joint effusion. Soft tissues are unremarkable. IMPRESSION: 1. Methylmethacrylate and spacer spanning the right knee joint space. Electronically Signed   By: Sharlet Salina M.D.   On: 08/20/2020 17:59   DG Abdomen Acute W/Chest  Result Date: 08/20/2020 CLINICAL DATA:  Abdominal and chest pain. EXAM: DG ABDOMEN ACUTE WITH 1 VIEW CHEST COMPARISON:  01/08/2011 FINDINGS: Heart size and mediastinal contours are unremarkable. There is no pleural effusion or edema identified. No airspace densities identified. No dilated loops of  large or small bowel. Previous bilateral hip arthroplasty. IMPRESSION: Negative abdominal radiographs.  No acute cardiopulmonary disease. Electronically Signed   By: Signa Kell M.D.   On: 08/20/2020 13:32   - pertinent xrays, CT, MRI studies were reviewed and independently interpreted  Positive ROS: All other systems have been reviewed and were otherwise negative with the exception of those mentioned in the HPI and as above.  Physical Exam: General: Alert, no acute distress Psychiatric: Patient is competent for consent with normal mood and affect Lymphatic: No axillary or cervical lymphadenopathy Cardiovascular: No pedal edema Respiratory: No cyanosis, no use of accessory musculature GI: No organomegaly, abdomen is soft and non-tender    Images:  @ENCIMAGES @  Labs:  Lab Results  Component Value Date   ESRSEDRATE 22 (H) 08/20/2020   CRP 3.4 (H) 08/20/2020   REPTSTATUS PENDING 08/20/2020   CULT PENDING 08/20/2020    Lab Results  Component Value Date   ALBUMIN 2.9 (L) 08/20/2020    No results found for: CBC  Neurologic: Patient does not have protective  sensation bilateral lower extremities.   MUSCULOSKELETAL:   Skin: Examination patient does not have any swelling or effusion of the right knee.  The knee has a antibiotic spacer that keeps the knee in extension.  His white cell count is 3.8 sed rate 22 with a CRP of 3.4.  There is a needle stick in the lateral joint line of his knee but I do not see any culture requests in the chart.  Blood cultures have been obtained.  Patient states he is status post bilateral total shoulders as well as bilateral total hip replacements.  He has had no replacement surgery on the left knee.  Assessment: Assessment: Status post 2 antibiotic spacers as well as PICC line antibiotics for an infected right total knee arthroplasty.  Plan: Discussed surgical options with attempted revision total knee arthroplasty with the increased risks of infection versus above-knee amputation.  Patient states he does not want to consider an amputation at this time.  I will discuss treatment options with him further.  It would be good to have infectious disease weigh in on the patient's options.  Thank you for the consult and the opportunity to see Mr. Theadore Blunck, MD [facility] [phone] 2:40 PM

## 2020-08-25 LAB — CULTURE, BLOOD (ROUTINE X 2): Culture: NO GROWTH

## 2020-08-26 LAB — CULTURE, BLOOD (ROUTINE X 2): Culture: NO GROWTH

## 2020-08-28 ENCOUNTER — Other Ambulatory Visit: Payer: Self-pay

## 2020-08-28 ENCOUNTER — Ambulatory Visit (INDEPENDENT_AMBULATORY_CARE_PROVIDER_SITE_OTHER): Payer: Medicare Other | Admitting: Family

## 2020-08-28 ENCOUNTER — Telehealth: Payer: Self-pay | Admitting: Family

## 2020-08-28 DIAGNOSIS — T8450XA Infection and inflammatory reaction due to unspecified internal joint prosthesis, initial encounter: Secondary | ICD-10-CM

## 2020-08-28 MED ORDER — HYDROCODONE-ACETAMINOPHEN 5-325 MG PO TABS: 1.0000 | ORAL_TABLET | Freq: Four times a day (QID) | ORAL | 0 refills | Status: AC | PRN

## 2020-08-28 NOTE — Telephone Encounter (Signed)
Patient aware this was called in for him  

## 2020-08-28 NOTE — Telephone Encounter (Signed)
Pt called stating a pain rx was supposed to be called in for him but he's at the pharmacy and nothing is there

## 2020-09-01 ENCOUNTER — Encounter: Payer: Self-pay | Admitting: Family

## 2020-09-01 NOTE — Progress Notes (Signed)
Office Visit Note   Patient: Jeremy Galvan           Date of Birth: 02/03/1956           MRN: [id number] Visit Date: 08/28/2020              Requested by: No referring provider defined for this encounter. PCP: Pcp, No  Chief Complaint  Patient presents with  . Right Knee - Pain      HPI: The patient is a 65 year old gentleman seen today for evaluation of right knee.  He has had previous total knee replacement on the right unfortunately this was complicated by infection.  He has had antibiotic spacer placement x2.  He has had now for opinions as well as infectious disease consultation.  It has been recommended to him to proceed with above-knee amputation on the right.  He presents today to discuss his surgical options with Dr. Lajoyce Corners.  He is requesting total knee arthroplasty placement.   Also complains of chronic right knee pain has not had any new fever or chills.    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 Dr. Lajoyce Corners evaluated patient while admitted and will follow-up in one week. Discussed with ID who feels that patient will likely need above the knee amputation and additional hardware are likely to be re-infected.  Assessment & Plan: Visit Diagnoses: No diagnosis found.  Plan: As Dr. Lajoyce Corners was not in the office today.  We will refill his pain medication x1.  He will follow-up in the office with Dr. Lajoyce Corners at his convenience to discuss his surgical options  Follow-Up Instructions: No follow-ups on file.   Ortho Exam  Patient is alert, oriented, no adenopathy, well-dressed, normal affect, normal respiratory effort. On examination of the right knee the incision is well-healed there is no open area.  He does have mild erythema and warmth of his right knee this is baseline for him.  Imaging: No results found. No images are attached to the encounter.  Labs: Lab  Results  Component Value Date   ESRSEDRATE 22 (H) 08/20/2020   CRP 3.4 (H) 08/20/2020   REPTSTATUS 08/26/2020 FINAL 08/20/2020   CULT  08/20/2020    NO GROWTH 5 DAYS Performed at [facility], [address], [place], [place] [address]      Lab Results  Component Value Date   ALBUMIN 2.9 (L) 08/20/2020    Lab Results  Component Value Date   MG 1.7 08/20/2020   No results found for: VD25OH  No results found for: PREALBUMIN CBC EXTENDED Latest Ref Rng & Units 08/21/2020 08/20/2020 01/10/2011  WBC 4.0 - 10.5 K/uL 3.8(L) 6.6 7.3  RBC 4.22 - 5.81 MIL/uL 3.24(L) 3.94(L) 3.98(L)  HGB 13.0 - 17.0 g/dL 9.5(J) 11.3(L) 10.3(L)  HCT 39.0 - 52.0 % 29.6(L) 37.0(L) 31.3(L)  PLT 150 - 400 K/uL 218 303 222  NEUTROABS 1.7 - 7.7 K/uL - 4.6 -  LYMPHSABS 0.7 - 4.0 K/uL - 1.1 -     There is no height or weight on file to calculate BMI.  Orders:  No orders of the defined types were placed in this encounter.  Meds ordered this encounter  Medications  . HYDROcodone-acetaminophen (NORCO/VICODIN) 5-325 MG tablet    Sig: Take 1 tablet by mouth every 6 (six) hours as needed for severe pain.    Dispense:  15 tablet  Refill:  0     Procedures: No procedures performed  Clinical Data: No additional findings.  ROS:  All other systems negative, except as noted in the HPI. Review of Systems  Objective: Vital Signs: There were no vitals taken for this visit.  Specialty Comments:  No specialty comments available.  PMFS History: Patient Active Problem List   Diagnosis Date Noted  . Prosthetic joint infection (HCC)   . Atrial fibrillation with rapid ventricular response (HCC)   . Right knee pain 08/20/2020   Past Medical History:  Diagnosis Date  . Arthritis   . Asthma   . Emphysema lung (HCC)   . Neuropathy     History reviewed. No pertinent family history.  History reviewed. No pertinent surgical history. Social History   Occupational History  . Occupation:  Disability  Tobacco Use  . Smoking status: Former Smoker    Types: Cigarettes  . Smokeless tobacco: Never Used  Vaping Use  . Vaping Use: Never used  Substance and Sexual Activity  . Alcohol use: Not Currently  . Drug use: Not Currently  . Sexual activity: Not Currently

## 2020-09-03 ENCOUNTER — Encounter: Payer: Self-pay | Admitting: Orthopedic Surgery

## 2020-09-03 ENCOUNTER — Ambulatory Visit: Payer: Medicare Other | Admitting: Orthopedic Surgery

## 2020-09-03 ENCOUNTER — Other Ambulatory Visit: Payer: Self-pay

## 2020-09-03 ENCOUNTER — Ambulatory Visit (INDEPENDENT_AMBULATORY_CARE_PROVIDER_SITE_OTHER): Payer: Medicare Other | Admitting: Orthopedic Surgery

## 2020-09-03 DIAGNOSIS — T8450XA Infection and inflammatory reaction due to unspecified internal joint prosthesis, initial encounter: Secondary | ICD-10-CM

## 2020-09-03 MED ORDER — GABAPENTIN 300 MG PO CAPS
600.0000 mg | ORAL_CAPSULE | Freq: Three times a day (TID) | ORAL | 3 refills | Status: DC
Start: 2020-09-03 — End: 2020-11-17

## 2020-09-03 NOTE — Progress Notes (Signed)
Office Visit Note   Patient: Jeremy Galvan           Date of Birth: 25-Aug-1955           MRN: [id number] Visit Date: 09/03/2020              Requested by: No referring provider defined for this encounter. PCP: Pcp, No  Chief Complaint  Patient presents with  . Right Knee - Follow-up    Hx periprosthetic 08/2019 [facility]        HPI: Patient is a 65 year old gentleman who was seen in follow-up for a previous right knee periprosthetic total joint infection.  Patient has had 2 antibiotic spacers placed his current antibiotic spacer is not anatomic.  Patient has consulted infectious disease that recommends above-the-knee amputation.  Patient states he would like to consider a revision total knee arthroplasty.  Assessment & Plan: Visit Diagnoses:  1. Infection of prosthetic joint, initial encounter (HCC)     Plan: Discussed the risks of recurrent infection with placing a total knee back into a joint that has had previous infections as well as the loss of muscle function and the scar tissue I doubt that a total knee arthroplasty would be functional.  Again discussed the importance of either proceeding with a above-the-knee amputation versus continuing with ambulation with his nonanatomic antibiotic spacer.  Patient requested a refill for Neurontin this prescription was provided 600 mg 3 times a day.  At follow-up in 3 months obtain 2 view radiographs of the right knee.  Follow-Up Instructions: Return if symptoms worsen or fail to improve.   Ortho Exam  Patient is alert, oriented, no adenopathy, well-dressed, normal affect, normal respiratory effort. Examination patient has significant quad atrophy on the right.  He has essentially no range of motion of the right knee with a nonanatomic antibiotic spacer.  There is no effusion no redness no cellulitis no tenderness to palpation there is no warmth.  Radiographs show the antibiotic spacer with significant osteoporosis and lytic  changes around the antibiotic spacer.  Imaging: No results found. No images are attached to the encounter.  Labs: Lab Results  Component Value Date   ESRSEDRATE 22 (H) 08/20/2020   CRP 3.4 (H) 08/20/2020   REPTSTATUS 08/26/2020 FINAL 08/20/2020   CULT  08/20/2020    NO GROWTH 5 DAYS Performed at [facility], [address] [place], [place] [address]      Lab Results  Component Value Date   ALBUMIN 2.9 (L) 08/20/2020    Lab Results  Component Value Date   MG 1.7 08/20/2020   No results found for: VD25OH  No results found for: PREALBUMIN CBC EXTENDED Latest Ref Rng & Units 08/21/2020 08/20/2020 01/10/2011  WBC 4.0 - 10.5 K/uL 3.8(L) 6.6 7.3  RBC 4.22 - 5.81 MIL/uL 3.24(L) 3.94(L) 3.98(L)  HGB 13.0 - 17.0 g/dL 3.2(T) 11.3(L) 10.3(L)  HCT 39.0 - 52.0 % 29.6(L) 37.0(L) 31.3(L)  PLT 150 - 400 K/uL 218 303 222  NEUTROABS 1.7 - 7.7 K/uL - 4.6 -  LYMPHSABS 0.7 - 4.0 K/uL - 1.1 -     There is no height or weight on file to calculate BMI.  Orders:  No orders of the defined types were placed in this encounter.  Meds ordered this encounter  Medications  . gabapentin (NEURONTIN) 300 MG capsule    Sig: Take 2 capsules (600 mg total) by mouth 3 (three) times daily. 3 times a day when necessary neuropathy pain  Dispense:  90 capsule    Refill:  3     Procedures: No procedures performed  Clinical Data: No additional findings.  ROS:  All other systems negative, except as noted in the HPI. Review of Systems  Objective: Vital Signs: There were no vitals taken for this visit.  Specialty Comments:  No specialty comments available.  PMFS History: Patient Active Problem List   Diagnosis Date Noted  . Prosthetic joint infection (HCC)   . Atrial fibrillation with rapid ventricular response (HCC)   . Right knee pain 08/20/2020   Past Medical History:  Diagnosis Date  . Arthritis   . Asthma   . Emphysema lung (HCC)   . Neuropathy     History reviewed. No  pertinent family history.  History reviewed. No pertinent surgical history. Social History   Occupational History  . Occupation: Disability  Tobacco Use  . Smoking status: Former Smoker    Types: Cigarettes  . Smokeless tobacco: Never Used  Vaping Use  . Vaping Use: Never used  Substance and Sexual Activity  . Alcohol use: Not Currently  . Drug use: Not Currently  . Sexual activity: Not Currently

## 2020-11-16 ENCOUNTER — Telehealth: Payer: Self-pay | Admitting: Orthopedic Surgery

## 2020-11-16 NOTE — Telephone Encounter (Signed)
Rx refill Gabapentin  [facility]

## 2020-11-17 MED ORDER — GABAPENTIN 300 MG PO CAPS: 600.0000 mg | ORAL_CAPSULE | Freq: Three times a day (TID) | ORAL | 3 refills | Status: AC

## 2020-12-03 ENCOUNTER — Ambulatory Visit: Payer: Medicare Other | Admitting: Orthopedic Surgery

## 2020-12-07 ENCOUNTER — Ambulatory Visit: Payer: Medicare Other | Admitting: Orthopedic Surgery
